# Patient Record
Sex: Male | Born: 1970
Health system: Southern US, Community
[De-identification: ages and names within clinical notes are randomized; demographics above are authoritative.]

## PROBLEM LIST (undated history)

## (undated) DIAGNOSIS — J42 Unspecified chronic bronchitis: Secondary | ICD-10-CM

## (undated) DIAGNOSIS — K589 Irritable bowel syndrome without diarrhea: Secondary | ICD-10-CM

## (undated) DIAGNOSIS — I10 Essential (primary) hypertension: Secondary | ICD-10-CM

## (undated) DIAGNOSIS — M545 Low back pain, unspecified: Secondary | ICD-10-CM

## (undated) DIAGNOSIS — F419 Anxiety disorder, unspecified: Secondary | ICD-10-CM

## (undated) DIAGNOSIS — G8929 Other chronic pain: Secondary | ICD-10-CM

## (undated) DIAGNOSIS — J449 Chronic obstructive pulmonary disease, unspecified: Secondary | ICD-10-CM

## (undated) DIAGNOSIS — Z9289 Personal history of other medical treatment: Secondary | ICD-10-CM

## (undated) HISTORY — PX: HERNIA REPAIR: SHX51

## (undated) HISTORY — PX: LUMBAR LAMINECTOMY: SHX95

## (undated) HISTORY — PX: EYE SURGERY: SHX253

## (undated) HISTORY — PX: INGUINAL HERNIA REPAIR: SUR1180

## (undated) HISTORY — PX: NM MYOVIEW LTD: HXRAD82

## (undated) HISTORY — PX: UMBILICAL HERNIA REPAIR: SHX196

---
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
## 1998-07-16 DIAGNOSIS — Z8739 Personal history of other diseases of the musculoskeletal system and connective tissue: Secondary | ICD-10-CM

## 1998-07-16 HISTORY — PX: HAND SURGERY: SHX662

## 1998-07-16 HISTORY — DX: Personal history of other diseases of the musculoskeletal system and connective tissue: Z87.39

## 2000-01-25 ENCOUNTER — Encounter: Payer: Self-pay | Admitting: Neurosurgery

## 2000-01-29 ENCOUNTER — Encounter: Payer: Self-pay | Admitting: Neurosurgery

## 2000-01-29 ENCOUNTER — Ambulatory Visit (HOSPITAL_COMMUNITY): Admission: RE | Admit: 2000-01-29 | Discharge: 2000-01-30 | Payer: Self-pay | Admitting: Neurosurgery

## 2000-01-30 ENCOUNTER — Encounter: Payer: Self-pay | Admitting: Neurosurgery

## 2002-06-26 ENCOUNTER — Encounter: Admission: RE | Admit: 2002-06-26 | Discharge: 2002-09-24 | Payer: Self-pay

## 2002-09-24 ENCOUNTER — Encounter: Admission: RE | Admit: 2002-09-24 | Discharge: 2002-12-23 | Payer: Self-pay

## 2002-12-28 ENCOUNTER — Encounter
Admission: RE | Admit: 2002-12-28 | Discharge: 2003-03-28 | Payer: Self-pay | Admitting: Physical Medicine & Rehabilitation

## 2003-03-31 ENCOUNTER — Encounter
Admission: RE | Admit: 2003-03-31 | Discharge: 2003-06-29 | Payer: Self-pay | Admitting: Physical Medicine & Rehabilitation

## 2003-08-13 ENCOUNTER — Encounter
Admission: RE | Admit: 2003-08-13 | Discharge: 2003-11-11 | Payer: Self-pay | Admitting: Physical Medicine & Rehabilitation

## 2003-12-29 ENCOUNTER — Encounter
Admission: RE | Admit: 2003-12-29 | Discharge: 2004-03-28 | Payer: Self-pay | Admitting: Physical Medicine & Rehabilitation

## 2004-02-20 ENCOUNTER — Emergency Department (HOSPITAL_COMMUNITY): Admission: EM | Admit: 2004-02-20 | Discharge: 2004-02-20 | Payer: Self-pay | Admitting: Emergency Medicine

## 2004-03-31 ENCOUNTER — Encounter
Admission: RE | Admit: 2004-03-31 | Discharge: 2004-06-28 | Payer: Self-pay | Admitting: Physical Medicine & Rehabilitation

## 2004-03-31 ENCOUNTER — Ambulatory Visit: Payer: Self-pay | Admitting: Physical Medicine & Rehabilitation

## 2004-06-28 ENCOUNTER — Encounter
Admission: RE | Admit: 2004-06-28 | Discharge: 2004-09-26 | Payer: Self-pay | Admitting: Physical Medicine & Rehabilitation

## 2004-09-04 ENCOUNTER — Ambulatory Visit: Payer: Self-pay | Admitting: Physical Medicine & Rehabilitation

## 2004-09-26 ENCOUNTER — Encounter
Admission: RE | Admit: 2004-09-26 | Discharge: 2004-12-25 | Payer: Self-pay | Admitting: Physical Medicine & Rehabilitation

## 2004-10-26 ENCOUNTER — Ambulatory Visit: Payer: Self-pay | Admitting: Physical Medicine & Rehabilitation

## 2004-12-27 ENCOUNTER — Encounter
Admission: RE | Admit: 2004-12-27 | Discharge: 2005-03-27 | Payer: Self-pay | Admitting: Physical Medicine & Rehabilitation

## 2004-12-27 ENCOUNTER — Ambulatory Visit: Payer: Self-pay | Admitting: Physical Medicine & Rehabilitation

## 2009-10-26 ENCOUNTER — Inpatient Hospital Stay (HOSPITAL_COMMUNITY): Admission: AD | Admit: 2009-10-26 | Discharge: 2009-11-02 | Payer: Self-pay | Admitting: Psychiatry

## 2009-10-26 ENCOUNTER — Emergency Department (HOSPITAL_COMMUNITY): Admission: EM | Admit: 2009-10-26 | Discharge: 2009-10-26 | Payer: Self-pay | Admitting: Emergency Medicine

## 2009-10-26 ENCOUNTER — Ambulatory Visit: Payer: Self-pay | Admitting: Psychiatry

## 2009-11-03 ENCOUNTER — Other Ambulatory Visit (HOSPITAL_COMMUNITY): Admission: RE | Admit: 2009-11-03 | Discharge: 2010-01-09 | Payer: Self-pay | Admitting: Psychiatry

## 2010-01-12 ENCOUNTER — Ambulatory Visit: Payer: Self-pay | Admitting: Psychiatry

## 2010-02-20 ENCOUNTER — Emergency Department (HOSPITAL_COMMUNITY): Admission: EM | Admit: 2010-02-20 | Discharge: 2010-02-20 | Payer: Self-pay | Admitting: Emergency Medicine

## 2010-09-29 LAB — GLUCOSE, CAPILLARY: Glucose-Capillary: 108 mg/dL — ABNORMAL HIGH (ref 70–99)

## 2010-09-29 LAB — COMPREHENSIVE METABOLIC PANEL
ALT: 17 U/L (ref 0–53)
AST: 18 U/L (ref 0–37)
Albumin: 4 g/dL (ref 3.5–5.2)
Chloride: 105 mEq/L (ref 96–112)
Creatinine, Ser: 1.03 mg/dL (ref 0.4–1.5)
GFR calc Af Amer: >60 mL/min (ref >60)
Potassium: 3.4 mEq/L — ABNORMAL LOW (ref 3.5–5.1)
Sodium: 138 mEq/L (ref 135–145)
Total Bilirubin: 0.7 mg/dL (ref 0.3–1.2)

## 2010-09-29 LAB — CBC
Hemoglobin: 12.7 g/dL — ABNORMAL LOW (ref 13.0–17.0)
MCH: 29.9 pg (ref 26.0–34.0)
Platelets: 205 10*3/uL (ref 150–400)
RBC: 4.24 MIL/uL (ref 4.22–5.81)
WBC: 4.8 10*3/uL (ref 4.0–10.5)

## 2010-09-29 LAB — RAPID URINE DRUG SCREEN, HOSP PERFORMED
Amphetamines: NOT DETECTED
Tetrahydrocannabinol: NOT DETECTED

## 2010-09-29 LAB — ETHANOL: Alcohol, Ethyl (B): 5 mg/dL (ref 0–10)

## 2010-10-02 LAB — URINE DRUGS OF ABUSE SCREEN W ALC, ROUTINE (REF LAB)
Barbiturate Quant, Ur: NEGATIVE
Barbiturate Quant, Ur: NEGATIVE
Barbiturate Quant, Ur: NEGATIVE
Barbiturate Quant, Ur: NEGATIVE
Benzodiazepines.: NEGATIVE
Benzodiazepines.: NEGATIVE
Benzodiazepines.: NEGATIVE
Benzodiazepines.: NEGATIVE
Benzodiazepines.: NEGATIVE
Ethyl Alcohol: <10 mg/dL (ref <10)
Marijuana Metabolite: NEGATIVE
Marijuana Metabolite: NEGATIVE
Marijuana Metabolite: NEGATIVE
Marijuana Metabolite: NEGATIVE
Marijuana Metabolite: NEGATIVE
Methadone: NEGATIVE
Opiate Screen, Urine: NEGATIVE
Phencyclidine (PCP): NEGATIVE
Propoxyphene: NEGATIVE
Propoxyphene: NEGATIVE
Propoxyphene: NEGATIVE
Propoxyphene: NEGATIVE
Propoxyphene: NEGATIVE

## 2010-10-03 LAB — URINE DRUGS OF ABUSE SCREEN W ALC, ROUTINE (REF LAB)
Amphetamine Screen, Ur: NEGATIVE
Barbiturate Quant, Ur: NEGATIVE
Barbiturate Quant, Ur: NEGATIVE
Barbiturate Quant, Ur: NEGATIVE
Benzodiazepines.: NEGATIVE
Benzodiazepines.: NEGATIVE
Benzodiazepines.: NEGATIVE
Ethyl Alcohol: <10 mg/dL (ref <10)
Ethyl Alcohol: <10 mg/dL (ref <10)
Ethyl Alcohol: <10 mg/dL (ref <10)
Marijuana Metabolite: NEGATIVE
Marijuana Metabolite: NEGATIVE
Marijuana Metabolite: NEGATIVE
Propoxyphene: NEGATIVE

## 2010-10-04 LAB — DIFFERENTIAL
Eosinophils Relative: 1 % (ref 0–5)
Lymphocytes Relative: 16 % (ref 12–46)
Lymphs Abs: 1.4 10*3/uL (ref 0.7–4.0)
Monocytes Absolute: 0.9 10*3/uL (ref 0.1–1.0)
Neutro Abs: 6.7 10*3/uL (ref 1.7–7.7)

## 2010-10-04 LAB — RAPID URINE DRUG SCREEN, HOSP PERFORMED
Amphetamines: NOT DETECTED
Benzodiazepines: NOT DETECTED
Cocaine: POSITIVE — AB

## 2010-10-04 LAB — CBC
HCT: 45.5 % (ref 39.0–52.0)
Hemoglobin: 15.9 g/dL (ref 13.0–17.0)
RBC: 5.2 MIL/uL (ref 4.22–5.81)
WBC: 9.2 10*3/uL (ref 4.0–10.5)

## 2010-10-04 LAB — ETHANOL: Alcohol, Ethyl (B): <5 mg/dL (ref 0–10)

## 2010-10-04 LAB — BASIC METABOLIC PANEL
GFR calc Af Amer: >60 mL/min (ref >60)
GFR calc non Af Amer: >60 mL/min (ref >60)
Glucose, Bld: 142 mg/dL — ABNORMAL HIGH (ref 70–99)
Potassium: 3.7 mEq/L (ref 3.5–5.1)
Sodium: 136 mEq/L (ref 135–145)

## 2010-12-01 NOTE — Assessment & Plan Note (Signed)
A 40 year old male with history of left L5-S1 lumbar disk herniation with  post laminectomy syndrome status post surgery in July of 2002. He was last  seen by me on August 16, 2003. He has had left foot numbness and tingling  as well as his usual axial low back pain. He had some right trapezius pain  and left thoracic paraspinal pain as well. He continues to work 50 or 60  hours a week, does not do much in terms of lifting but walks up to 7 miles  an average work day in a [employer] where he states the temperature was  105 degrees yesterday. His last spinal injection was April of 2004 which  were bilateral facet injections, L4-5/L5-S1 per Dr. Leanord Asal which  resulted in 0 pain score post injection.   His current pain score is 2 to 6/10 and averaging 3/10. His pain improves  with rest and medication, made worse with walking, bending, sitting,  working, but this is relieved by his pain medications.   SOCIAL HISTORY:  Married, smokes a pack a day. Vocational as above.   CURRENT MEDICATIONS:  Hydrocodone 7.5/325 one p.o. b.i.d.   REVIEW OF SYSTEMS:  Positive for numbness in his foot and spasms in his foot  and leg and poor sleep. He has no suicidal thoughts.   PHYSICAL EXAMINATION:  VITAL SIGNS:  Blood pressure 114/65, pulse 88,  respirations 20, O2 saturation 99% on room air.  NEUROLOGICAL:  Gait is normal. Affect is alert. Deep tendon reflexes are  normal bilateral lower extremities. He has decreased sensation to pin in the  left lower extremity, but this is over the entire extremity without  dermatomal pattern.  MUSCULOSKELETAL:  Full range of motion in hips, knees, ankles. No pain to  palpation in the lower extremities. Mild tenderness to palpation in the left  thoracic paraspinals as well as right upper trapezius and mild tenderness to  palpation of the left gluteus medius area. He has full strength bilateral  upper and lower extremities. He does have some limited range of  motion, left  hand.  History of left ulnar injury, does have some reduced grip strength in  the hand on the left side.   IMPRESSION:  1. Lumbar post laminectomy syndrome with lower extremity paresthesias and     radiculitis.  2. Lumbar facet syndrome confirmed by reduction of pain after facet     injection.   PLAN:  1. It is discussed with the patient from injection standpoint that if his     left lumbosacral radiculitis symptoms are more constant, we can do a S1     selective nerve root block on the left side.  2. I believe he would benefit from lumbar facet blocks followed by     radiofrequency if this does alleviate his pain. As he would need Valium     prior to any procedure, he would like to hold off on this right now.  3. Continue current medications.      Erick Colace, M.D.   AEK/MedQ  D:  12/31/2003 11:53:18  T:  12/31/2003 14:57:24  Job #:  [id number]

## 2010-12-01 NOTE — Consult Note (Signed)
NAME:  Jared Robinson, Jared Robinson                         ACCOUNT NO.:  [id number]   MEDICAL RECORD NO.:  [id number]                   PATIENT TYPE:  REC   LOCATION:  [facility]                                  FACILITY:  [facility]   PHYSICIAN:  Zachary George, DO                      DATE OF BIRTH:  09/23/1970   DATE OF CONSULTATION:  08/04/2002  DATE OF DISCHARGE:                                   CONSULTATION   The patient returns to the clinic today as scheduled for a lumbar epidural  steroid injection for degenerative disk disease of the lumbar spine, status  post L5-S1 hemilaminectomy with persistent low back pain and bilateral lower  extremity radicular symptoms as well as bilateral testicular pain which  started following his spinal surgery.  The patient was last seen on July 30, 2002.  He has had no significant change in the interim.  I reviewed  health and history form and 14-point review of systems.   PHYSICAL EXAMINATION:  VITAL SIGNS: Blood pressure 122/65, pulse 93,  respirations 20, O2 saturation 98% on room air.   IMPRESSION:  1. Degenerative disk disease of the lumbar spine with low back pain and     bilateral lower extremity radicular symptoms.  2. Post laminectomy syndrome.  3. Testicular pain, etiology uncertain.   PLAN:  1. Lumbar epidural steroid injection.  2. Continue current medications to include Zonegran 100 mg two p.o. daily,     Norco 7.5 mg one p.o. t.i.d. as needed, and Bextra 20 mg daily.  3. The patient is to return to the clinic in two weeks for reevaluation and     possible repeat injection as predicate upon the patient's response to     symptoms.   DESCRIPTION OF PROCEDURE:  Lumbar epidural steroid injection.  The procedure  was described to the patient in detail including risks, benefits,  limitations, and alternatives.  Risks include, but are not limited to  bleeding, infection, spinal headache, lower extremity numbness, and  paresthesias, failure to relieve  pain, increased back pain, and allergic  reaction to medications.  The patient understands the risks and wishes to  proceed.  The informed consent was obtained. The patient was brought back to  the fluoroscopy suite and placed on the table in the prone position. The  skin was prepped and draped in the usual sterile fashion.  The skin and  subcutaneous tissue were anesthetized with 3 cc of 1% preservative-free  lidocaine.  Under direct fluoroscopic guidance an 18 gauge 3.5 inch Hustead  needle was advanced in the left paramedian L4-5 epidural space with loss of  resistance technique. There were no CSF, heme, or paresthesia noted. This  was then followed by the injection of 1.5 cc of Kenalog 40 mg/cc plus 2.5 cc  normal saline with needle flush.  The patient tolerated the procedure well.  He  did have slight vasovagal reaction.  His blood pressure and pulse  oximetry were monitored closely.  The patient was released in stable  condition.  Blood pressure was 123/84 on discharge.  Discharge instructions  were given.  The patient's pain score on discharge was 0/10.   The patient was educated on above findings and recommendations and  understands. There were no barriers to communication.                                               Zachary George, DO    JW/MEDQ  D:  08/04/2002  T:  08/05/2002  Job:  [id number]

## 2010-12-01 NOTE — Assessment & Plan Note (Signed)
HISTORY OF PRESENT ILLNESS:  A 40 year old male with a history of left L5-S1  lumbar disc herniation.  Has had mainly axial back pain mid-back and  sometimes into his left iliac crest region.  He was last seen by me on  April 02, 2003.  He was to return in December; however, he forgot to  schedule an appointment and this was his next available.  He remains on  hydrocodone 7.5/325 mg 1 p.o. b.i.d. but has been out of his Skelaxin 800 mg  for the last month or so.  He has had some increased pain and discomfort  trying to sleep.  On bad days, he does use some Lidoderm patch but this is  only once a week or so.  He used to work 50 to 60 hours a week.  He does not  do any lifting but walks up to 7 miles during an average workday.  His last  spinal injection was on April July, 2004 which was bilateral facet  injections at L4/5 and L5-S1 per Dr. Zachary George.  The patient did not have  any fascicular pain.   He has had physical therapy prescribed; however, he declined to go because  of copay increase from $15 to $35 a visit.  He is off his Bextra.  Tends to  use his ibuprofen for flare ups.   Pain score is about a 6 out of 10.  He is averaging 4 and ranging from 2 to  6.  His pain exacerbating factors are bending forward, sitting, working, and  improving with rest.   SOCIAL HISTORY:  Pack a day smoker x 8 years.  Married and has two children.  Works full-time.   REVIEW OF SYMPTOMS:  Positive for spasms in the left side of the low back as  well as poor sleep.   PHYSICAL EXAMINATION:  VITAL SIGNS:  Blood pressure 119/68, pulse 85,  respirations 16 and regular.  O2 saturation 99% on room air.  NEUROLOGICAL:  Gait is normal.  Affect is alert, although somewhat irritable  due to pain.  PULSE:  Normal.  BACK:  There is no tenderness to palpation along the lumbar paraspinal or  along the iliac crest.  No pain over the gluteus medius area.  He states  that actually the palpation feels good  rather than hurts.  He is able to  bend forward to about 90 degrees in his low back.  Extension shows he has  about 25% range and this hurts much more than flexion.  He can do lateral  bending bilaterally as well as twisting, approximately 50% range  bilaterally.  His full range of motion bilateral lower extremity is normal.  No pain to palpation in the bilateral lower extremities.  Normal strength in  bilateral lower extremities and no deep tendon reflexes in bilateral lower  extremities.   IMPRESSION:  1. Lumbar facet arthropathy:  He has had previous complete relief from     lumbar vertebrae-4/5 and lumbar verterbrae-5/sacral vertebrae-1 intra-     articular facet joint injections.  I discussed the procedure of     radiofrequency lesioning of the medial branches to the facet joint levels     but this would have to be preceded by a medial branch block using     Lidocaine.  He is somewhat apprehensive about injections and needles in     general and would like to think about this.  In the meantime, we will     continue  hydrocodone 7.5 mg b.i.d. as well as reinitiate Skelaxin 800 mg     b.i.d.  Also, the patient is to use the Lidocaine patch on a p.r.n.     basis.  He can use Bextra for flare-up, although he is somewhat reluctant     to do so based on media stories regarding __________ .   If he does elect to have lumbar medial branch blocks, we will give him 10 mg  of Valium p.o. for the procedure.   DIAGNOSES:  1. Lumbar facet arthropathy, 724.8.  2. Lumbar degenerative disc disease, 722.52.      Jared Robinson, M.D.   AEK/MedQ  D:  08/16/2003 16:51:13  T:  08/16/2003 18:11:38  Job #:  [id number]   cc:   Hewitt Shorts, M.D.  [address]  [place]  [place] [address]  Fax: [phone]   Excell Seltzer. Annabell Howells, M.D.  [address]  [place]  [place] [address]  Fax: [phone]

## 2010-12-01 NOTE — Assessment & Plan Note (Signed)
MEDICAL RECORD NUMBER:  [id number].   A 40 year old male with a history of lumbar post laminectomy syndrome and  lumbar facet syndrome.  Last seen by me on December 31, 2003.  At that time,  discussed selective nerve root block versus facet medial branch blocks to  evaluate for radiofrequency.  He states that his pain level has remained  fairly constant, about 5/10 on average and going from 2-6.  His pain  improves with rest and medication and is made worse with bending and  sitting.   He continues to smoke a pack a day x 7 years.  He continues to work full  time.   REVIEW OF SYSTEMS:  Poor sleep.  Some spasms in his back.   PHYSICAL EXAMINATION:  Blood pressure 137/68, pulse 90, respirations 20, O2  saturation 97% on room air.   CURRENT MEDICATIONS:  Vicodin 7.5/500 mg one p.o. b.i.d.   PHYSICAL EXAMINATION:  GENERAL APPEARANCE:  No acute distress.  Mood and  affect appropriate.  BACK:  Some tenderness at left lumbar paraspinal at L5.  NEUROLOGIC:  He has normal deep tendon reflexes in bilateral lower  extremities.  Normal strength in bilateral lower extremities.  Normal  sensation in bilateral lower extremities.   IMPRESSION:  1.  Lumbar post laminectomy syndrome.  2.  Chronic lower extremity paresthesias.  3.  Radiculitis.  This has been overshadowed by more axial back pain.  More      left-sided lumbar facet syndrome has been confirmed by facet injections.   PLAN:  1.  Recommend lumbar medial branch blocks.  He is quite phobic in regards to      needles and really needs to think about this.  2.  Continue current medication, Vicodin 7.5/500 mg one p.o. b.i.d.  3.  I will see him back in three months unless he decides to undergo the      injections, then I will see him back sooner than that.      Erick Colace, M.D.    AEK/MedQ  D:  04/03/2004 17:50:32  T:  04/04/2004 11:58:44  Job #:  [id number]

## 2010-12-01 NOTE — Consult Note (Signed)
NAME:  Jared Robinson, Jared Robinson                         ACCOUNT NO.:  [id number]   MEDICAL RECORD NO.:  [id number]                   PATIENT TYPE:  REC   LOCATION:  [facility]                                  FACILITY:  [facility]   PHYSICIAN:  Jared George, DO                      DATE OF BIRTH:  [birth year]   DATE OF CONSULTATION:  07/30/2002  DATE OF DISCHARGE:                                   CONSULTATION   HISTORY:  Jared Robinson returns to clinic today for a re-evaluation.  He was  last seen on 07/13/02.  In the interim he has had an MRI of his thoracic  spine which does not reveal any herniated disks but does reveal multiple  Schmorl's nodes which raises the possibility for Scheuermann's disease  however, I doubt this as patient does not have any significant thoracic  pain.  He does have persistent testicular pain for which I was looking for  nerve root impingement at the lower thoracic/upper lumbar region.  Patient  states that following last visit he felt like he was getting a little bit  better with Zonegran 200 mg daily and Bextra 20 mg daily however, over the  past few days he has had increased testicular pain and describes some  swelling and significant tenderness to palpation of his testicles.  When he  saw Jared Robinson previously he did not have any swelling or tenderness to  palpation.  He also continues to have low back pain radiating to the left  buttock and right anterior thigh and describes a deep itch in his lower  back.  He is status post left L5-S1 hemilaminectomy with diskectomy.  I  reviewed health and history form, 14 point review of systems.  Pain today is  9/10 on a subjective scale.  He continues taking hydrocodone 7.5 mg three  times per day.  Function and quality of life indices have declined.  His  pain has gotten bad enough where he has thought about calling in sick to  work.  In addition to the hydrocodone he has taken Bextra and Zonegran but  has run out of the Zonegran.   PHYSICAL EXAMINATION:  GENERAL:  Physical examination reveals a healthy male  in no acute distress.  VITALS:  Blood pressure is 128/60, pulse 79, respirations 20, oxygen  saturation 98% on room air.  NEUROLOGICALLY:  Intact in the lower extremities including motor, sensory  and reflexes at this time.  GENITALIA:  Examination of the testicles reveals significant tenderness to  palpation over the right testicle without any masses.  The right testicle  does feel modestly larger than the left testicle.  There is no scrotal  swelling noted and no discoloration.   IMPRESSION:  1. Degenerative disk disease of the lumbar spine, status post left L5-S1     hemilaminectomy with persistent low back pain and bilateral lower  extremity radicular symptoms.  2. Testicular pain, etiology uncertain.  Patient has significant local     tenderness to the right testicle today.   PLAN:  1. Discussed treatment options with Jared Robinson.  In terms of his lower     back and lower extremity radicular pain it is reasonable to proceed with     a lumbar epidural steroid injection and I discussed this with him at     length.  I am uncertain whether or not the lumbar epidural steroid     injection will help his testicular pain.  If his testicular pain persists     I would like to have him re-evaluated by Jared Robinson at some point if he     continues to have significant local tenderness.  2. Continue Norco 7.5 mg/325 mg one p.o. t.i.d. as needed, #50 without     refills.  3. Continue Bextra 20 mg one p.o. daily, #30 with one refill.  4. Resume Zonegran 100 mg two p.o. daily, #60 with one refill.  5. Patient to return to clinic for lumbar epidural steroid injection.   Patient was educated on the above findings and recommendations and  understands.  There were no barriers to communication.                                               Jared George, DO    JW/MEDQ  D:  07/30/2002  T:  07/30/2002  Job:  [id number]

## 2010-12-01 NOTE — Op Note (Signed)
[facility]  Patient:    Jared Robinson, Jared Robinson                      MRN: [id number] Proc. Date: 01/29/00 Adm. Date:  [id number] Disc. Date: [id number] Attending:  Barton Fanny CC:         Hewitt Shorts, M.D.                           Operative Report  PREOPERATIVE DIAGNOSIS:  Left L5-S1 lumbar disk herniation.  POSTOPERATIVE DIAGNOSIS:  Left L5-S1 lumbar disk herniation.  PROCEDURE:  Left L5-S1 lumbar laminotomy and microdiskectomy.  SURGEON:  Hewitt Shorts, M.D.  ASSISTANT:  Tanya Nones. Jeral Fruit, M.D.  ANESTHESIA:  General endotracheal.  INDICATION FOR PROCEDURE:  The patient 40 year old man who presented with left lumbar radiculopathy that was found to be secondary to a moderate sized left L5-S1 disk herniation and the decision was made to proceed with left elective laminotomy and microdiskectomy.  PROCEDURE IN DETAIL:  The patient was brought to operating room and placed under general endotracheal anesthesia.  The patient was turned to a prone position and the lumbar region was prepped with Betadine and soap solution and draped in a sterile fashion.  The midline was infiltrated with local anesthetic of epinephrine and a midline incision was made and carried down through the subcutaneous tissue.  Bipolar cautery and electrocautery were used to maintain hemostasis.  Dissection was carried down to the lumbar fascia which was incised on the left side of the midline in the paraspinal muscle with dissection of the spinous process and lamina in a subperiosteal fashion. The L5-S1 interlaminar space was identified and this localization was confirmed with x-ray.  Laminotomies were performed using the Midas Rex drill with ______ bur and Kerrison punches.  ______ was removed and the underlying thecal sac and nerve were identified.  The microscope was draped and brought in the field to provide instrument location, illumination  and visualization.  The remainder of the procedure was performed using microdissection technique.  The thecal sac and nerve root were mobilized medially exposing the disk herniation.  The remaining annular fibers were incised and disk fragment removed.  There was extensive degenerative disk material and this was gradually mobilized.  The disk space was entered and additional degenerative disk material was removed.  ______ degeneration, thickening of the posterior longitudinal ligament was removed as well.  A third diskectomy was performed with removal of all disk fragments from both the disk space and the epidural space and then good decompression of thecal sac and nerve root were achieved.  Once the diskectomy was completed hemostasis was accomplished with the use of bipolar cautery and then 2 ccs of Fentanyl and 80 mg of Depo-Medrol were infused into the epidural space.  The wound was then closed in multiple layers.  The deep fascia was closed with interrupted 0 Vicryl with sutures of the subcutaneous, subcuticular and posterior ______ were inverted with 2-0 Vicryl sutures.  The skin was reapproximated with Steri-Strips and was dressed with Adaptic and sterile gauze.  The patient tolerated the procedure well.  The estimated blood loss was 50 ccs.  Sponge counts were correct.  Following surgery, the patient was turned back to the supine position to be reversed from the anesthetic, extubated and transferred to the recovery room for further care. DD:  01/29/00 TD:  01/29/00 Job: [id number]

## 2010-12-01 NOTE — H&P (Signed)
[place]  Patient:    Jared Robinson, Jared Robinson                      MRN: [id number] Adm. Date:  [id number] Attending:  Barton Fanny CC:         Hewitt Shorts, M.D.                         History and Physical  CHIEF COMPLAINT: The patient is a 40 year old right-handed white male who was evaluated for left lumbar radiculopathy and left L5-S1 lumbar disk herniation.  HISTORY OF PRESENT ILLNESS: He explains he has had some intermittent mild radicular discomfort running down through his left lower extremity over the past 1-1/2 years.  He has been treated with medications and exercise but his difficulties worsened about 1-1/2 months ago when he picked something up and then two days later developed excruciating pain to his low back.  Subsequently the pain extended down into the left lower extremity and was disabling, with pain radiating to the left buttock, posterior thigh and calf, into the foot and toward the left great toe.  He has had minimal symptoms in his right lower extremity.  He has had a sense of weakness in his left lower extremity but denies any numbness or paresthesias.  He tried bed rest and medications but continued to have significant discomfort that required surgical intervention, and is now admitted for such.  He finds that even minimal walking tends to aggravate his pain and discomfort.  PAST MEDICAL HISTORY: There is no history of hypertension, myocardial infarction, cancer, stroke, diabetes, peptic ulcer disease, or lung disease.  PAST SURGICAL HISTORY:  1. Herniorrhaphy in 1976 and 1986.  2. Hand surgery for traumatic injury to the left hand.  ALLERGIES: ASPIRIN causes rash.  MEDICATIONS: His only medication is Vicodin for pain.  FAMILY HISTORY: His parents are both in good health at age 21.  SOCIAL HISTORY: The patient is married and has six children.  He smokes a pack of cigarettes a day and has been smoking off  and on for at least the past six years.  He drinks alcoholic beverages socially.  He denies history of substance abuse.  He works as a Merchandiser, retail at [employer].  REVIEW OF SYSTEMS: Notable for those symptoms described in the History of Present Illness and past medical history, but otherwise unremarkable.  PHYSICAL EXAMINATION:  GENERAL: The patient is a well-developed, well-nourished white male in obvious discomfort and with limited mobility.  VITAL SIGNS: Height 5 feet 10 inches.  Weight 158 pounds.  Temperature 997.7 degrees, pulse 94, respiratory rate 16, blood pressure 143/75.  LUNGS: Clear to auscultation.  Symmetrical respiratory excursion.  HEART: Regular rate and rhythm.  Normal S1 and S2 with no murmur.  ABDOMEN: Soft, nondistended.  Bowel sounds present.  EXTREMITIES: No clubbing, cyanosis, or edema.  MUSCULOSKELETAL: Tenderness to palpation of the lumbar region, particularly the left side and low back and less so off to the right side of the low back. His mobility is significantly limited.  He is able to flex to about 15-20 degrees.  He is limited in extension as well to as little as five degrees. Straight leg raising is positive on the left on 15-20 degrees and straight leg raising is negative on the right.  NEUROLOGIC: Examination shows 5/5 strength of the distal lower extremities including dorsiflexion, plantar flexion, and extensor hallucis longus; however,  in the distal left lower extremity his ability to exert full effort is limited by pain.  Strength seems to be at least 4-4+/5 in the dorsiflexion, plantar flexion, and extensor hallucis longus.  Sensation is intact to pinprick throughout the lower extremities.  Reflexes are 1-2 at the quadriceps, gastrocnemius and are symmetric bilaterally.  Toes are downgoing bilaterally.  DIAGNOSTIC STUDIES: MRI of the lumbar spine shows desiccation of the L5-S1 disk with loss of disk space height and left L5-S1  lumbar disk herniation moderate in size with left S1 nerve root compression.  IMPRESSION: Acute left lumbar radiculopathy with left L5-S1 lumbar disk herniation superimposed upon underlying degenerative disk disease.  PLAN: The patient is admitted for left L5-S1 lumbar laminotomy and microdiskectomy.  We discussed the alternatives to surgical intervention as well as the nature of surgery, typical length of surgery and hospital stay, and overall recuperation, his limitations during the postoperative period, and risk of surgery including risk of infection, bleeding, possible need for transfusion, risk of nerve dysfunction with pain, weakness, numbness, or paresthesias, risk of recurrent disk herniation with possible need for further surgery, and anesthetic risks of myocardial infarction, stroke, pneumonia, and death, all of which are increased due to his smoking history.  Understanding all this the patient does wish to proceed with surgery, and is admitted for such. DD:  01/29/00 TD:  01/29/00 Job: [id number]

## 2010-12-01 NOTE — Consult Note (Signed)
NAME:  Jared Robinson, Jared Robinson                         ACCOUNT NO.:  [id number]   MEDICAL RECORD NO.:  [id number]                   PATIENT TYPE:  REC   LOCATION:  [facility]                                  FACILITY:  [facility]   PHYSICIAN:  Zachary George, DO                      DATE OF BIRTH:  [birth year]   DATE OF CONSULTATION:  10/13/2002  DATE OF DISCHARGE:                                   CONSULTATION   REASON FOR CONSULTATION:  The patient returns to clinic today, originally  scheduled for a repeat lumbar steroid epidural steroid injection for  degenerative disk disease of the lumbar spine with chronic low back pain  with bilateral lower extremity radicular symptoms.  The patient was last  seen on September 04, 2002.  In the interim the patient has had nearly  complete resolution of his testicular and lower extremity radicular pain.  He has had on one occasion some numbness in his right lower extremity but  that only lasted for a few minutes and resolved.  His pain seems to be very  well controlled and he has actually started to decrease his medications.  He  states he stopped taking Zonegran and Bextra and has decreased his Norco to  one to two per day.  He has been taking Norco 7.5/325.  His pain today is  3/10 on a subjective scale.  His function and quality of life indices have  improved.  His sleep has improved as well.  I reviewed health and history  form, a 14-point review of systems.  No new neurologic complaints.   PHYSICAL EXAMINATION:  GENERAL:  Reveals a healthy male in no acute  distress.  VITAL SIGNS:  Blood pressure 120/60, pulse 82, respirations 16, O2  saturation 100% on room air.  NEUROLOGIC:  Intact in the lower extremities including motor, sensory, and  reflexes today.   IMPRESSION:  1. Low back pain with bilateral lower extremity radicular symptoms,     essentially resolved.  2. Testicular pain, essentially resolved.  3. Degenerative disk disease of the lumbar spine  status post lumbar     laminectomy for herniated disk.   PLAN:  1. The patient and I discussed further treatment options.  At this point it     is reasonable to continue with the Bextra 20 mg daily as needed and to     discontinue hydrocodone on a regular basis.  I have instructed him to     keep the hydrocodone to use as a rescue medication only.  2. The patient to continue with a home exercise program.  3. If symptoms seem to worsen or he has an exacerbation, he should return to     clinic for further evaluation.  Would consider repeat epidural steroid     injection as predicated upon the patient's symptoms and findings.     Otherwise, I  will have him follow up on an as-needed basis.  I have given     him a new prescription today for Bextra 20 mg one p.o. daily as needed     #30 with three refills.  The patient to follow up with his primary care     Bader Stubblefield.   The patient was educated on the above findings and recommendations and  understands.  No barriers to communication.                                               Zachary George, DO    JW/MEDQ  D:  10/13/2002  T:  10/13/2002  Job:  [id number]

## 2010-12-01 NOTE — Discharge Summary (Signed)
[place]  Patient:    Jared Robinson, Jared Robinson                      MRN: [id number] Adm. Date:  [id number] Disc. Date: [id number] Attending:  Barton Fanny                           Discharge Summary  HISTORY OF PRESENT ILLNESS:  Patient is a 40 year old gentleman who presented with a left lumbar radiculopathy that was found to be secondary to an L5-S1 lumbar disk herniation.  He had been treated with analgesics with anti-inflammatories without relief.  PHYSICAL EXAMINATION:  ___________ on examination was limited due to pain. His general exam was unremarkable.  Neurologic examination: Positive straight leg raising on the left at 15-20 degrees but otherwise it was intact.  HOSPITAL COURSE:  Patient was admitted, underwent an left L5-S1 lumbar laminotomy and microdiskectomy from which he has done well.  He has had excellent relief of his radicular pain and has been up and ambulating.  He has been given instructions on wound care and activities following discharge.  He is to return to my office in three weeks for follow up.  He is to call today for that appointment.  A discharge prescription was given for Percocet one to two tablets p.o. q.4-6h. p.r.n. pain, 40 tabs prescribed, no refills.  He is also advised to continue on the Naprosyn that he was taking prior to discharge.  His admission chest x-ray described a 1.5 cm round density in the left lower chest that was felt to maybe be a nipple shadow.  He had a PA view of the chest repeated with nipple markers and the abnormality does not correspond to his nipple and the radiologist suggested a CT scan through the chest.  This has been ordered and will be performed prior to discharge and we will follow up with the patient in the office regarding its results and if further consultation is indicated it will be arranged.  DISCHARGE DIAGNOSES: 1. Lumbar disk herniation. 2. Lumbar degenerative disk  disease. 3. Lumbar radiculopathy. DD:  01/30/00 TD:  01/30/00 Job: [id number]

## 2010-12-01 NOTE — Consult Note (Signed)
NAME:  Jared Robinson, Jared Robinson                         ACCOUNT NO.:  [id number]   MEDICAL RECORD NO.:  [id number]                   PATIENT TYPE:  REC   LOCATION:  [facility]                                  FACILITY:  [facility]   PHYSICIAN:  Zachary George, DO                      DATE OF BIRTH:  1970-11-10   DATE OF CONSULTATION:  DATE OF DISCHARGE:                                   CONSULTATION   [facility]:   HISTORY OF PRESENT ILLNESS:  The patient returns to clinic today for  reevaluation.  He was last seen on 08/04/2002 at which time he underwent a  lumbar epidural steroid injection for degenerative disk disease of the  lumbar spine with low back pain and bilateral lower extremity radicular  symptoms status post lumbar hemilaminectomy at L5-S1 for lumbar disk  herniation.  The patient also has testicular pain of uncertain etiology.  He  states that following the lumbar epidural steroid injection he had increased  pain for about 3 days.  After that his pain decreased significantly and he  still notices significant improvement in his lower back pain and lower  extremity radicular symptoms.  He states that he had decreased right  testicular pain for about 2 days after the procedure however, this now back  to baseline.  He continues to notice increased lower back and testicular  pain with increased walking.  He has been able to decrease his need for  hydrocodone following the injection with increased function and quality of  life indices.  His pain today is a 4-5/10 on a subjective scale at this  time.  He states that he and his wife have been sexually active since I last  him and he noticed a prominent vein on his penis with what he describes as  three knots with pain.  He denies any scrotal swelling.  He also describes  some intermittent pain in his left upper lumbar region radiating to his  flank and right upper quadrant.  He states that the right upper  quadrant  pain seems to be worse when his right testicle hurts.  His left testicle  does not seem to hurt him so much except when he palpates it directly.  He  denies any bowel and bladder dysfunction.  He denies any hematuria.  Denies  constipation or diarrhea.  Denies symptoms of irritable bowel syndrome.  He  would like to follow up with his urologist prior to any further  interventional procedures.  I reviewed the health and history form and 14-  point review of systems.  He continues on Bextra 20 mg daily and Zonegran  200 mg daily for radicular component.  He also continues hydrocodone 7.5  mg/325 mg as needed occasionally only needing one pill per day.  His sleep  is good.  Again, his function and quality  of life indices have improved  following the epidural injection.   PHYSICAL EXAMINATION:  GENERAL:  Healthy-appearing male in no acute  distress.  VITAL SIGNS:  Blood pressure is 128/70, pulse 88, respirations 18, O2  saturation is 98% on room air.  SPINE:  Level pelvis without scoliosis.  There are no signs of infection.  There is some tenderness to palpation in the left upper lumbar paraspinous  muscles with no pain on percussion of the paraspinous and left flank.  ABDOMEN:  Nondistended soft abdomen with bowel sounds.  There is mild  tenderness to palpation in the right upper quadrant.  No rebound tenderness,  rigidity, or guarding noted.  No abdominal bruits noted.  NEUROLOGICAL:  Intact to motor, light touch, and reflexes in the lower  extremities.   IMPRESSION:  1. Low back pain with bilateral lower extremity radicular symptoms,     significantly improved.  2. Degenerative disk disease of the lumbar spine status post lumbar     laminectomy for a herniated disk.  3. Postlaminectomy syndrome.  4. Testicular pain, etiology uncertain.   PLAN:  1. Discussed further treatment options with the patient.  This was extensive     consultation of 25 minutes' duration.  At this  point it would be     reasonable to repeat the lumbar epidural steroid injection to further     decrease the patient's lower back and lower extremity radicular symptoms.     However, I do not know if this will help relieve his testicular pain.  I     am still uncertain whether or not the testicular pain is secondary to     spinal origin as there was not any evidence of disk protrusion or disk     herniation in the upper lumbar/lower thoracic region.  The patient would     like to follow up with his urologist, Dr. Annabell Howells, before any further     interventional procedures.  2. Continue Bextra 20 mg daily.  3. Continue Zonegran 200 mg daily.  4. Continue hydrocodone as needed.  I would like to decrease the narcotic     pain medication over time.  The interventional procedure should help in     decreasing need for narcotic-based pain medication as well as increasing     the patient's function and quality of life indices in regards to his     lower back and lower extremity radicular pain.  5. The patient to return to clinic in 2 weeks for reevaluation or sooner as     needed.  Will consider repeat lumbar epidural steroid injection.   The patient was educated on above findings and recommendations and  understands.  There were no barriers to communication.                                               Zachary George, DO    JW/MEDQ  D:  08/18/2002  T:  08/18/2002  Job:  [id number]   cc:   Excell Seltzer. Annabell Howells, M.D.  [address]  [place]  [place] [address]  Fax: [phone]

## 2010-12-01 NOTE — Consult Note (Signed)
NAME:  Jared Robinson, Jared Robinson                         ACCOUNT NO.:  [id number]   MEDICAL RECORD NO.:  [id number]                   PATIENT TYPE:  REC   LOCATION:  [facility]                                  FACILITY:  [facility]   PHYSICIAN:  Zachary George, DO                      DATE OF BIRTH:  Mar 07, 1971   DATE OF CONSULTATION:  06/29/2002  DATE OF DISCHARGE:                                   CONSULTATION   <REFERRING PHYSICIAN/>  Excell Seltzer. Annabell Howells, M.D.   Dear Dr. Annabell Howells,  Thank you very much for kindly referring the [facility]. The patient was seen in the  clinic today. Please refer to the following for details regarding the  history and physical examination and treatment plan. Once again, thank you  for allowing [place] to participate in the care of the patient.   CHIEF COMPLAINT:  Lower back and testicular pain.   HISTORY OF PRESENT ILLNESS:  The patient is a pleasant 40 year old right-  hand dominant male who complains of lower back pain radiating to his left  posterolateral thigh and occasionally into his left  anterior thigh. He is  status post left L5-S1 laminectomy in July 2001 for what he describes as a  herniated disk with left lower extremity radicular symptoms per Dr.  Newell Coral.   Over the past five to six months, the patient has developed pain in  bilateral testicles, right greater than left, radiating into his right  medial thigh with perineal discomfort as well. According to his report, he  was  found to have  an enlarged prostate on  physical examination and was  referred to Dr. Annabell Howells. Further workup included scrotal ultrasound which  revealed mild fluid in bilateral scrotal sacs. Otherwise normal. He also  underwent a CT scan of his abdomen which according to his report is  negative.   An MRI of the lumbar spine was performed on April 03, 2002, which  revealed mild ligamentum of Flavum hypertrophy with  minimal  concentric disk  bulging at L4-5. At L5-S1, there are post surgical  changes with evidence of  a left laminectomy and scar tissue, minimally deforming the left aspect of  the thecal sac with the nerve roots in normal position with no deviation or  displacement. There is some minimal disk bulging to the right of midline  with some minimal right foraminal narrowing.   Current treatment includes hydrocodone 7.5 mg, 0 to 4 per day. The patient  has tried Vioxx without relief and Ultracet without relief. He continues  performing back exercises which were instructed by Dr. Newell Coral. He has not  had any physical therapy. He denies any lumbar spinal injections. He notes  increased pain with stress. Currently his pain level is a 6/10 on a  subjective scale and described as achy, sharp and  stabbing, worse with  walking and occasionally with increased back pain with coughing, sneezing  and bowel movements. His symptoms are improved with medications. He states  that would rather not have to take hydrocodone long-term, but it does help  him with the pain. His functional quality of life indices have declined. His  sleep is fair to poor. I reviewed health and history form of 14 point review  of systems.   PAST MEDICAL HISTORY:  Lower back pain.   PAST SURGICAL HISTORY:  1. L5-S1 lumbar laminectomy.  2. Left hand surgery.  3. Left inguinal herniorrhaphy.  4. Umbilical herniorrhaphy.   FAMILY HISTORY:  Degenerative disk disease   SOCIAL HISTORY:  The patient smokes one pack of cigarettes per day and I  counseled him on the importance of smoking cessation in terms of  pain and  overall health. The patient admits to social alcohol use. He denied illicit  drug use. He is married and has six children, three of whom are step  children. 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   ALLERGIES:  ASPIRIN CAUSES RASH.   MEDICATIONS:  Hydrocodone 7.5 mg p.r.n.    PHYSICAL EXAMINATION:  GENERAL:  A healthy male in no acute distress.  BACK:  Examination reveals a slightly lower right hemipelvis without gross  scoliosis. There is decreased lumbar lordosis with a healed vertical midline  incisional scar. Further examination of the thoracolumbar paraspinals  reveals mild tenderness. I am not able to elicit the patient's complaint  symptoms with palpitation. The range of motion of the lumbar spine is  guarded in flexion but full in extension without much discomfort.   Manual muscle testing is 5/5 bilateral lower extremities. Sensory  examination is intact to light touch bilateral lower extremities including  the peroneal and scrotal region. Muscle stretch reflexes are 2+/4 bilateral  patella and medial hamstrings and Achilles. Straight leg raising is negative  bilaterally but with tight hamstrings. Pearlean Brownie is negative bilaterally with  tight hip flexors.   Testicular examination does not reveal any masses or discomfort on  palpation.   IMPRESSION:  1. Degenerative disk disease of the lumbar spine, status post L5-S1     laminectomy with persistent low back pain and left lower extremity     radicular symptoms.  2. Testicular pain, etiology uncertain. This may be secondary to     thoracolumbar somatic dysfunction and possibly even facet arthropathy.   PLAN:  1. I had a discussion with the patient regarding treatment options.     Initially I would like to gather more records to include clinic notes     and operative report from Dr. Newell Coral.  2. Will begin Zonegran 100 mg p.o. q.d. for neuropathic component.  3. Will begin Bextra 20 mg 1 p.o. q.d. for primary pain control.  4. Will continue with Norco 7.5  mg /325 mg 1 p.o. t.i.d. p.r.n. pain not     controlled with the above medications.  5. Consider physical therapy to review a lumbar stabilization program.  6. Consider a trial of lumbar epidural steroids. 7. The patient is to return to the clinic in  two weeks for reevaluation.   The patient was educated about the findings and recommendations and  understands.  There were no barriers to communication.  Zachary George, DO    JW/MEDQ  D:  06/29/2002  T:  06/29/2002  Job:  [id number]

## 2010-12-01 NOTE — Assessment & Plan Note (Signed)
MEDICAL RECORD NUMBER:  [id number]   DATE OF BIRTH:  [birth year]   Jared Robinson follows up today, he is in the nursing followup clinic. He was  last seen by MD June 30, 2004.  He has a history of lumbar post  laminectomy syndrome.   He has done well in the last month although last visit he had increased pain  and numbness without any history of trauma. This did eventually subside over  the course of a couple of weeks.   Current pain is 2/10, average is 4/10.  His sleep is fair. Pain gets worse  with prolonged sitting as well as standing but better with walking. Relief  with meds about 80%.   Current pain regimen is Vicodin 7.5 b.i.d.   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 He has some  numbness and tingling and some spasms in the lower extremity on review of  systems.  No suicidal ideation.   SOCIAL HISTORY:  He is married.   PHYSICAL EXAMINATION:  Blood pressure 122/69, pulse 92, respiratory rate 16,  O2 sat 99% on room air.   The patient is alert and oriented x3. His gait is normal. His back has some  minor tenderness to palpation in the lumbar paraspinal's. He has full  strength in the bilateral lower extremities, he has full range of motion in  the bilateral lower extremities, he has full range of motion bilateral lower  extremities, normal strength bilateral lower extremities, normal deep tendon  reflexes bilateral lower extremities. He is able to toe walk and heel walk.  His range of motion in the spine is 75% forward flexion and extension. He  has some pain with extension greater than flexion.   IMPRESSION:  1.  Lumbar facet syndrome with low back pain.  2.  Lumbar post laminectomy syndrome. He has some intermittent radiculitis.   PLAN:  1.  Continue current medications.  2.  Continue nursing visits in between MD visits.      AEK/MedQ  D:  10/27/2004 12:17:32  T:  10/27/2004 12:51:41  Job #:  [id number]

## 2010-12-01 NOTE — Consult Note (Signed)
NAME:  Jared Robinson, Jared Robinson                         ACCOUNT NO.:  [id number]   MEDICAL RECORD NO.:  [id number]                   PATIENT TYPE:  REC   LOCATION:                                       FACILITY:   PHYSICIAN:  Zachary George, DO                      DATE OF BIRTH:  1971/01/03   DATE OF CONSULTATION:  09/04/2002  DATE OF DISCHARGE:                                   CONSULTATION   HISTORY OF PRESENT ILLNESS:  The patient returns to the clinic today for  reevaluation.  He was last seen in clinic on August 18, 2002.  He was  originally scheduled to possibly undergo a lumbar epidural steroid injection  today, however, he did not bring a driver with him.  He also wanted to wait  until he spoke with his urologist, Excell Seltzer. Annabell Howells, M.D., prior to proceeding  with further infections.  He was supposed to have an appointment yesterday,  but he states that he has been working two shifts at work and forgot about  the appointment.  He will reschedule.  He continues to complain of low back  pain, but states it has improved overall.  He has significantly decreased  morning stiffness.  He still has some pain involving his left posterior  thigh and right anterior thigh.  His anterior right thigh pain seems to be  associated with his testicular pain.  Overall his radicular symptoms have  improved.  He has noted some intermittent numbness in his left foot with  walking.  It lasts just a brief amount of time.  His pain today is a 4/10 on  a subjective scale.  Function and quality of life indices have improved, but  remain declined overall.  He feels like the epidural sternoid injection on  August 04, 2002, helped him.  His testicular pain is also improved.  He  states that previously it was a 10/10 on a subjective scale and now his  testicular pain is a 6/10 on a subjective scale and intermittent.  Previously it was constant.  I reviewed the health and history form and 14-  point review of systems.   The patient and I spent a considerable time  discussing his symptoms and further treatment options.  I also answered  several questions.  This was a 25-minute evaluation.   PHYSICAL EXAMINATION:  GENERAL APPEARANCE:  A healthy male in no acute  distress.  VITAL SIGNS:  Blood pressure 133/69, pulse 94, respirations 18, O2  saturation 97% on room air.  BACK:  Lower right hemipelvis of approximately 3/8 inch.  There is  tenderness to palpation, left greater than right lumbar paraspinous muscles.  Range of motion is functional.  NEUROLOGIC:  Examination is intact in the lower extremities, including  motor, sensory, and reflexes.  EXTREMITIES:  The patient has slight leg length discrepancy when examined in  a supine position.  The right leg appears to be approximately 3/8 inch  shorter.   IMPRESSION:  1. Low back pain with bilateral lower extremity radicular symptoms,     improved.  2. Degenerative disk disease of the lumbar spine, status post lumbar     laminectomy of left L5-S1 for herniated disk.  3. Post laminectomy syndrome.  4. Testicular pain, etiology uncertain.  5. Leg length discrepancy with right leg shorter by 3/8 inch.   PLAN:  1. Will continue current medications to include Bextra 20 mg daily, Zonegran     200 mg daily, and Norco 7.5 mg/325 mg one p.o. t.i.d. as needed.  2. A 1/4 inch heel lift is to be used gradually up to eight hours per day     and then will increase to 3/8 inch heel lift on the right.  3. Recommend repeating lumbar epidural steroid injection as the patient     still has room for improvement.  I discussed this with him at length,     including risks, benefits, limitations, and alternatives again.  He     wishes to proceed.  Will schedule him in two weeks.  4. The patient is to follow up with his urologist.   The patient was educated on the above findings and recommendations and  understands.  There were no barriers to communication.                                                Zachary George, DO    JW/MEDQ  D:  09/04/2002  T:  09/04/2002  Job:  [id number]   cc:   Excell Seltzer. Annabell Howells, M.D.  [address]  [place]  [place] [address]  Fax: [phone]

## 2010-12-01 NOTE — Assessment & Plan Note (Signed)
MEDICAL RECORD NUMBER:  [id number].   DATE OF BIRTH:  [birth year].   Jared Robinson returns today after I last saw him approximately three months  ago. A 40 year old male with history of lumbar post laminectomy syndrome  with lumbar facet syndrome last seen by me three months ago. He has done  relatively well. His pain level ranges from 3 to 6. He is a bit worse today  because he worked a double shift last night and got poor sleep.   He works full time. His pain is mainly worse with sitting up straight,  bending backwards. Improves with pulling his knees to his chest.   SOCIAL HISTORY:  As noted above. He is married, smokes a pack a day for 7  years.   REVIEW OF SYSTEMS:  Numbness, weakness in the left lower extremity.   PHYSICAL EXAMINATION:  Blood pressure 117/72, pulse 85, respiratory rate 16,  O2 98% on room air.   Back has no tenderness to palpation of lumbar spine. He has pain with  extension and relative relief with flexion. He has good range of the hips,  negative Faber's, and no pain to palpation bilateral hips. Normal deep  tendon reflexes bilaterally lower extremities.   IMPRESSION:  1.  Lumbar post laminectomy syndrome.  2.  Lumbar facet syndrome which has been confirmed by facet injections in      the past.   RECOMMENDATIONS:  He has been extremely stable on Vicodin 7.5 p.o. b.i.d.  Will refer him to the [facility] followup clinic, and I discussed this with him.  He will see me in about 12 months, unless he decides to go through injection  which he has been contemplating, but not wanting to do secondary to a needle  phobia. He has had no signs of dosage escalation. No signs of early  prescription filling, and no suicidal tendencies.      Andr   AEK/MedQ  D:  06/30/2004 10:02:29  T:  06/30/2004 15:09:04  Job #:  [id number]

## 2010-12-01 NOTE — Consult Note (Signed)
NAME:  Jared Robinson, Jared Robinson                         ACCOUNT NO.:  [id number]   MEDICAL RECORD NO.:  [id number]                   PATIENT TYPE:  REC   LOCATION:  [facility]                                  FACILITY:  [facility]   PHYSICIAN:  Zachary George, DO                      DATE OF BIRTH:  07-30-1970   DATE OF CONSULTATION:  DATE OF DISCHARGE:                                   CONSULTATION   HISTORY OF PRESENT ILLNESS:  The patient returns to the clinic today for  reevaluation.  He was initially seen on June 29, 2002.  In the interim,  he has not noticed any significant change other than worsening of his pain  over the past week.  He continues to complain of low back pain radiating to  bilateral buttocks, right posterior thigh, and leg, as well as pain  radiating to bilateral testicles.  He has taken Zonegran 100 mg per day over  the past two weeks for radicular component, which has not really helped his  symptoms.  He has not taken Bextra as he was concerned with his aspirin  allergy.  He states that the aspirin caused him to have a rash.  He has  taken multiple nonacetylated nonsteroidal anti-inflammatories in the past  without any reactive.  His pain today is a 7/10 on a subjective scale.  Function and quality of life remains declined, although he continues  working.  He does seem to be somewhat discouraged with the current  situation.  I reviewed the health and history form and 14-point review of  systems.  He continues to smoke one packs of cigarettes per day and I  counsel him on the importance of smoking cessation.   PHYSICAL EXAMINATION:  VITAL SIGNS:  The blood pressure is 123/63, pulse 83,  respirations 16, and O2 saturations 99% on room air.  BACK:  Tenderness to palpation in the thoracolumbar paraspinous region with  tight paraspinal muscles.  There is minimal tenderness to palpation in the  lumbar paraspinous region as well.  NEUROLOGIC:  Manual muscle testing is 5/5 in bilateral  lower extremities.  The sensory examination is intact to light touch in bilateral lower  extremities at this time.  Muscle stretch reflexes are 2+/4 in bilateral  patellae, medial hamstrings, and Achilles.  Straight leg raise is negative  bilaterally, but with tight hamstrings.   IMPRESSION:  1. Degenerative disk disease of the lumbar spine, status post L5-S1     laminectomy with persistent low back pain and left lower extremity     radicular symptoms.  2. Testicular pain, etiology uncertain.  Again, I feel that this potentially     could be secondary to somatic dysfunction of the thoracolumbar region,     but cannot conclusively rule out disk herniation.   PLAN:  1. Will obtain an MRI of the thoracic spine without contrast  to rule out     herniated disk with attention to the lower thoracic region.  2. Increase Zonegran to 200 mg per day and I provide the patient with 28     sample pills.  3. Start Bextra 20 mg daily and the patient has 15 sample pills.  I instruct     him to discontinue if he has any type of allergic reaction.  4. Continue Norco 7.5 mg one-half to one p.o. t.i.d. as needed, #50 without     refills.  5. Consider physical therapy and possibly even lumbar epidural steroid     injections as predicated upon the patient's response, symptoms, and     imaging studies.  6. The patient is to return to the clinic in two weeks for reevaluation.   The patient was educated on the above findings and recommendations and  understands.  There were no barriers to communication.                                               Zachary George, DO    JW/MEDQ  D:  07/13/2002  T:  07/13/2002  Job:  [id number]   cc:   Excell Seltzer. Annabell Howells, M.D.  [address], [address]  [place]  [place] [address]  Fax: [phone]

## 2014-02-08 ENCOUNTER — Ambulatory Visit (INDEPENDENT_AMBULATORY_CARE_PROVIDER_SITE_OTHER): Payer: BC Managed Care – PPO

## 2014-02-08 ENCOUNTER — Ambulatory Visit (INDEPENDENT_AMBULATORY_CARE_PROVIDER_SITE_OTHER): Payer: BC Managed Care – PPO | Admitting: Internal Medicine

## 2014-02-08 VITALS — BP 108/74 | HR 83 | Temp 98.2°F | Resp 18 | Ht 70.0 in | Wt 160.4 lb

## 2014-02-08 DIAGNOSIS — L02419 Cutaneous abscess of limb, unspecified: Secondary | ICD-10-CM

## 2014-02-08 DIAGNOSIS — L03115 Cellulitis of right lower limb: Secondary | ICD-10-CM

## 2014-02-08 DIAGNOSIS — M25569 Pain in unspecified knee: Secondary | ICD-10-CM

## 2014-02-08 DIAGNOSIS — S60012A Contusion of left thumb without damage to nail, initial encounter: Secondary | ICD-10-CM

## 2014-02-08 DIAGNOSIS — S6000XA Contusion of unspecified finger without damage to nail, initial encounter: Secondary | ICD-10-CM

## 2014-02-08 DIAGNOSIS — M25561 Pain in right knee: Secondary | ICD-10-CM

## 2014-02-08 DIAGNOSIS — L03119 Cellulitis of unspecified part of limb: Secondary | ICD-10-CM

## 2014-02-08 MED ORDER — DOXYCYCLINE HYCLATE 100 MG PO TABS: 100.0000 mg | ORAL_TABLET | Freq: Two times a day (BID) | ORAL | Status: DC

## 2014-02-08 NOTE — Progress Notes (Signed)
   Subjective:  This chart was scribed for Tonye Pearson, MD by Charline Bills, ED Scribe. The patient was seen in room 8. Patient's care was started at 8:42 PM.   Patient ID: Jared Robinson, male    DOB: 1971-06-29, 43 y.o.   MRN: [id number]  Chief Complaint  Patient presents with  . Knee Pain    Pain and swelling rt knee across thigh   . Hand Pain    lt thumb hit with hammer  week ago  previous trauma to thumb   HPI HPI Comments: Jared Robinson is a 43 y.o. male who presents to the [facility] complaining of R knee pain that radiates up R thigh onset 2-3 days ago. Pt states that he hurt his R knee a few years ago without orthopedist follow up. Pt reports what he suspected was an ingrown hair to his R knee while working on his knees on carpet. Pt noted associated swelling to his R knee today and swollen lymph nodes in groin. Pt states that his coworker currently has MRSA.  Pt also reports previous trauma to L thumb. He states that he hit his thumb with a hammer 1 week ago. He states that his finger was purple when the incident occurred which has now resolved. Pt also reports a cold sensation to his L thumb. Pt reports loss of sensation to L thumb since initial trauma yeas ago.   Pt works as a Music therapist.   No past medical history on file. No current outpatient prescriptions on file prior to visit.   No current facility-administered medications on file prior to visit.   Allergies  Allergen Reactions  . Aspirin     Told he had rash as child   Review of Systems  Musculoskeletal: Positive for arthralgias and joint swelling.  Skin: Positive for wound.  Neurological: Positive for numbness (prior).      Objective:   Physical Exam  Constitutional: He is oriented to person, place, and time. He appears well-developed and well-nourished.  Eyes: Conjunctivae are normal.  Neck: Neck supple.  Musculoskeletal:  R leg:  Area of cellulitis on medial aspect to knee  with central pustule but no abscess The knee is stable to examination The area of erythema is 3 cm by 5 cm and is tender  L thumb: Is disfigured from prior injury and surgery with tenderness over proximal pharynx with mild swelling He does have weakness of grip but this is also a chronic problem since his surgery  Neurological: He is alert and oriented to person, place, and time.  XR of R thumbs shows chronic changes of prior injury with surgery infusion of joint and no sign of new fracture     Assessment & Plan:   I personally performed the services described in this documentation, which was scribed in my presence. The recorded information has been reviewed and is accurate.  Right knee pain /-Cellulitis of leg   Plan: Wound culture/doxy  Contusion of left thumb, initial encounter -reassured should improve to former status with rest and rom--ortho consult if not

## 2014-02-11 LAB — WOUND CULTURE
GRAM STAIN: NONE SEEN
Gram Stain: NONE SEEN

## 2014-02-12 ENCOUNTER — Telehealth: Payer: Self-pay

## 2014-02-12 NOTE — Telephone Encounter (Signed)
Patient notified lab results. See labs

## 2014-02-12 NOTE — Telephone Encounter (Signed)
Patient called regarding lab results. Patient states no one has called him yet. CB # [phone]

## 2014-02-12 NOTE — Telephone Encounter (Signed)
Please advise lab results.

## 2015-10-11 ENCOUNTER — Other Ambulatory Visit: Payer: Self-pay | Admitting: Internal Medicine

## 2015-10-11 ENCOUNTER — Ambulatory Visit
Admission: RE | Admit: 2015-10-11 | Discharge: 2015-10-11 | Disposition: A | Discharge status: Home / Self Care | Payer: No Typology Code available for payment source | Source: Ambulatory Visit | Attending: Internal Medicine | Admitting: Internal Medicine

## 2015-10-11 DIAGNOSIS — R079 Chest pain, unspecified: Secondary | ICD-10-CM

## 2015-10-18 ENCOUNTER — Other Ambulatory Visit: Payer: Self-pay | Admitting: Internal Medicine

## 2015-10-18 DIAGNOSIS — R9389 Abnormal findings on diagnostic imaging of other specified body structures: Secondary | ICD-10-CM

## 2015-10-24 ENCOUNTER — Ambulatory Visit
Admission: RE | Admit: 2015-10-24 | Discharge: 2015-10-24 | Disposition: A | Discharge status: Home / Self Care | Payer: No Typology Code available for payment source | Source: Ambulatory Visit | Attending: Internal Medicine | Admitting: Internal Medicine

## 2015-10-24 DIAGNOSIS — R9389 Abnormal findings on diagnostic imaging of other specified body structures: Secondary | ICD-10-CM

## 2016-10-10 DIAGNOSIS — K649 Unspecified hemorrhoids: Secondary | ICD-10-CM | POA: Diagnosis not present

## 2016-11-12 ENCOUNTER — Ambulatory Visit (HOSPITAL_COMMUNITY)
Admission: EM | Admit: 2016-11-12 | Discharge: 2016-11-12 | Disposition: A | Discharge status: Home / Self Care | Payer: BLUE CROSS/BLUE SHIELD | Attending: Internal Medicine | Admitting: Internal Medicine

## 2016-11-12 ENCOUNTER — Ambulatory Visit (INDEPENDENT_AMBULATORY_CARE_PROVIDER_SITE_OTHER): Payer: BLUE CROSS/BLUE SHIELD

## 2016-11-12 ENCOUNTER — Encounter (HOSPITAL_COMMUNITY): Payer: Self-pay | Admitting: Emergency Medicine

## 2016-11-12 DIAGNOSIS — K5900 Constipation, unspecified: Secondary | ICD-10-CM | POA: Diagnosis not present

## 2016-11-12 DIAGNOSIS — K5909 Other constipation: Secondary | ICD-10-CM | POA: Diagnosis not present

## 2016-11-12 DIAGNOSIS — R1030 Lower abdominal pain, unspecified: Secondary | ICD-10-CM

## 2016-11-12 DIAGNOSIS — K648 Other hemorrhoids: Secondary | ICD-10-CM | POA: Diagnosis not present

## 2016-11-12 DIAGNOSIS — R109 Unspecified abdominal pain: Secondary | ICD-10-CM | POA: Diagnosis not present

## 2016-11-12 DIAGNOSIS — R110 Nausea: Secondary | ICD-10-CM | POA: Diagnosis not present

## 2016-11-12 DIAGNOSIS — R21 Rash and other nonspecific skin eruption: Secondary | ICD-10-CM

## 2016-11-12 MED ORDER — NYSTATIN 100000 UNIT/GM EX CREA: TOPICAL_CREAM | CUTANEOUS | 1 refills | Status: DC

## 2016-11-12 MED ORDER — POLYETHYLENE GLYCOL 3350 17 G PO PACK: 17.0000 g | PACK | Freq: Every day | ORAL | 0 refills | Status: DC

## 2016-11-12 MED ORDER — HYDROCORTISONE ACETATE 25 MG RE SUPP: 25.0000 mg | Freq: Two times a day (BID) | RECTAL | 1 refills | Status: DC

## 2016-11-12 MED ORDER — HYDROCORTISONE 2.5 % RE CREA: TOPICAL_CREAM | RECTAL | 1 refills | Status: DC

## 2016-11-12 NOTE — ED Provider Notes (Signed)
CSN: [id number]     Arrival date & time 11/12/16  1709 History   None    Chief Complaint  Patient presents with  . Hemorrhoids   (Consider location/radiation/quality/duration/timing/severity/associated sxs/prior Treatment) Patient c/o constipation, internal and external hemorrhoids, rash on his perineum, and severe anal pruritis.     The history is provided by the patient.  Rash  Location: perineum. Quality: itchiness and redness   Severity:  Moderate Onset quality:  Sudden Duration:  3 days Timing:  Constant Progression:  Worsening Chronicity:  New Relieved by:  Nothing Worsened by:  Nothing   History reviewed. No pertinent past medical history. History reviewed. No pertinent surgical history. History reviewed. No pertinent family history. Social History  Substance Use Topics  . Smoking status: Current Every Day Smoker    Packs/day: 1.00    Types: Cigarettes  . Smokeless tobacco: Never Used  . Alcohol use No    Review of Systems  Constitutional: Negative.   HENT: Negative.   Eyes: Negative.   Respiratory: Negative.   Cardiovascular: Negative.   Gastrointestinal: Positive for constipation and rectal pain.  Endocrine: Negative.   Genitourinary: Negative.   Musculoskeletal: Negative.   Skin: Positive for rash.  Allergic/Immunologic: Negative.   Neurological: Negative.   Hematological: Negative.   Psychiatric/Behavioral: Negative.     Allergies  Aspirin  Home Medications   Prior to Admission medications   Medication Sig Start Date End Date Taking? Authorizing Provider  hydrocortisone (ANUSOL-HC) 2.5 % rectal cream Apply rectally 2 times daily 11/12/16   Deatra Canter, FNP  hydrocortisone (ANUSOL-HC) 25 MG suppository Place 1 suppository (25 mg total) rectally 2 (two) times daily. 11/12/16   Deatra Canter, FNP  nystatin cream (MYCOSTATIN) Apply to affected area 2 times daily 11/12/16   Deatra Canter, FNP  polyethylene glycol [facility] / GLYCOLAX)  packet Take 17 g by mouth daily. 11/12/16   Deatra Canter, FNP   Meds Ordered and Administered this Visit  Medications - No data to display  BP 134/89 (BP Location: Right Arm)   Pulse 96   Temp 98.7 F (37.1 C) (Oral)   Resp 16   SpO2 99%  No data found.   Physical Exam  Constitutional: He is oriented to person, place, and time. He appears well-developed and well-nourished.  HENT:  Head: Normocephalic and atraumatic.  Right Ear: External ear normal.  Left Ear: External ear normal.  Mouth/Throat: Oropharynx is clear and moist.  Eyes: Conjunctivae and EOM are normal. Pupils are equal, round, and reactive to light.  Neck: Normal range of motion. Neck supple.  Cardiovascular: Normal rate, regular rhythm and normal heart sounds.   Pulmonary/Chest: Effort normal and breath sounds normal.  Abdominal: Soft. Bowel sounds are normal.  Genitourinary:  Genitourinary Comments: Anus with chafing and erythema Rectal with good tone and discomfort, palpated internal hemorrhoids, no mass.   Neurological: He is alert and oriented to person, place, and time.  Skin:  Perineum with erythema  Nursing note and vitals reviewed.   Urgent Care Course     Procedures (including critical care time)  Labs Review Labs Reviewed - No data to display  Imaging Review Dg Abd 1 View  Result Date: 11/12/2016 CLINICAL DATA:  Abdominal pain x6 weeks with constipation and nausea. History of laminectomy. EXAM: ABDOMEN - 1 VIEW COMPARISON:  None. FINDINGS: The bowel gas pattern is normal. An average amount of fecal residue is seen within large bowel. A calcification most likely representing a phlebolith is  noted in the right lower pelvis. A punctate calcification in the region of the right renal pelvis may represent a tiny calculus. No radio-opaque calculi or other significant radiographic abnormality are seen. No acute osseous appearing abnormality. IMPRESSION: Punctate calcification in the region of the right  renal pelvis may represent a tiny renal stone. Unremarkable bowel gas pattern. Electronically Signed   By: Tollie Eth M.D.   On: 11/12/2016 18:45     Visual Acuity Review  Right Eye Distance:   Left Eye Distance:   Bilateral Distance:    Right Eye Near:   Left Eye Near:    Bilateral Near:         MDM   1. Internal hemorrhoids   2. Rash of perineum   3. Constipation, unspecified constipation type    anusol HC suppositories and cream Nystatin Cream for perineum Miralax for constipation      Deatra Canter, FNP 11/12/16 1920

## 2016-11-12 NOTE — ED Triage Notes (Signed)
The patient presented to the [facility] with a complaint of a hemorrhoid that started bothering him 11 weeks ago after moving a large object. The patient stated that he has been seen by his PCP and was prescribed a cream that he stated is not working.

## 2016-12-12 ENCOUNTER — Ambulatory Visit (HOSPITAL_COMMUNITY)
Admission: EM | Admit: 2016-12-12 | Discharge: 2016-12-12 | Disposition: A | Discharge status: Home / Self Care | Payer: BLUE CROSS/BLUE SHIELD | Attending: Family Medicine | Admitting: Family Medicine

## 2016-12-12 ENCOUNTER — Encounter (HOSPITAL_COMMUNITY): Payer: Self-pay | Admitting: *Deleted

## 2016-12-12 DIAGNOSIS — K5900 Constipation, unspecified: Secondary | ICD-10-CM | POA: Diagnosis not present

## 2016-12-12 DIAGNOSIS — S0990XA Unspecified injury of head, initial encounter: Secondary | ICD-10-CM | POA: Diagnosis not present

## 2016-12-12 DIAGNOSIS — S0101XA Laceration without foreign body of scalp, initial encounter: Secondary | ICD-10-CM | POA: Diagnosis not present

## 2016-12-12 DIAGNOSIS — K648 Other hemorrhoids: Secondary | ICD-10-CM

## 2016-12-12 MED ORDER — POLYETHYLENE GLYCOL 3350 17 G PO PACK: 17.0000 g | PACK | Freq: Two times a day (BID) | ORAL | 1 refills | Status: DC

## 2016-12-12 MED ORDER — HYDROCORTISONE 2.5 % RE CREA: TOPICAL_CREAM | RECTAL | 2 refills | Status: DC

## 2016-12-12 MED ORDER — DOCUSATE SODIUM 100 MG PO CAPS: 100.0000 mg | ORAL_CAPSULE | Freq: Two times a day (BID) | ORAL | 1 refills | Status: DC

## 2016-12-12 NOTE — ED Triage Notes (Signed)
Patient states he was struck by a piece of lumbar just prior to arrival on head. Denies dizziness or LOC. Patient reports mild headache. Bleeding controlled at this time.

## 2016-12-12 NOTE — ED Provider Notes (Signed)
CSN: [id number]     Arrival date & time 12/12/16  1529 History   None    Chief Complaint  Patient presents with  . Head Laceration   (Consider location/radiation/quality/duration/timing/severity/associated sxs/prior Treatment) Laceration to scalp today when he was struck on head.  Denies LOC or dizziness.  C/o constipation and anal pruritis.  His TD is up to date.   The history is provided by the patient.  Head Laceration  This is a new problem. The problem occurs constantly. The problem has not changed since onset.Nothing aggravates the symptoms.    History reviewed. No pertinent past medical history. History reviewed. No pertinent surgical history. History reviewed. No pertinent family history. Social History  Substance Use Topics  . Smoking status: Current Every Day Smoker    Packs/day: 1.00    Types: Cigarettes  . Smokeless tobacco: Never Used  . Alcohol use No    Review of Systems  Constitutional: Negative.   HENT: Negative.   Eyes: Negative.   Respiratory: Negative.   Cardiovascular: Negative.   Gastrointestinal: Positive for constipation and rectal pain.  Endocrine: Negative.   Genitourinary: Negative.   Musculoskeletal: Negative.   Skin: Positive for wound.  Allergic/Immunologic: Negative.   Neurological: Negative.   Hematological: Negative.   Psychiatric/Behavioral: Negative.     Allergies  Aspirin  Home Medications   Prior to Admission medications   Medication Sig Start Date End Date Taking? Authorizing Provider  docusate sodium (COLACE) 100 MG capsule Take 1 capsule (100 mg total) by mouth every 12 (twelve) hours. 12/12/16   Deatra Canter, FNP  hydrocortisone (ANUSOL-HC) 2.5 % rectal cream Apply rectally 2 times daily 12/12/16   Deatra Canter, FNP  hydrocortisone (ANUSOL-HC) 25 MG suppository Place 1 suppository (25 mg total) rectally 2 (two) times daily. 11/12/16   Deatra Canter, FNP  nystatin cream (MYCOSTATIN) Apply to affected area 2  times daily 11/12/16   Deatra Canter, FNP  polyethylene glycol [facility] / Ethelene Hal) packet Take 17 g by mouth 2 (two) times daily. 12/12/16   Deatra Canter, FNP   Meds Ordered and Administered this Visit  Medications - No data to display  There were no vitals taken for this visit. No data found.   Physical Exam  Constitutional: He is oriented to person, place, and time. He appears well-developed and well-nourished.  HENT:  Head: Normocephalic and atraumatic.  Eyes: Conjunctivae and EOM are normal. Pupils are equal, round, and reactive to light.  Neck: Normal range of motion. Neck supple.  Cardiovascular: Normal rate, regular rhythm and normal heart sounds.   Pulmonary/Chest: Effort normal and breath sounds normal.  Abdominal: Soft. Bowel sounds are normal.  Neurological: He is alert and oriented to person, place, and time.  Skin:  Right parietal scalp with 3 inch laceration.    Nursing note and vitals reviewed.   Urgent Care Course     .Marland KitchenLaceration Repair Date/Time: 12/12/2016 5:18 PM Performed by: Deatra Canter Authorized by: Elvina Sidle   Consent:    Consent obtained:  Verbal   Consent given by:  Patient   Risks discussed:  Infection and pain   Alternatives discussed:  No treatment Anesthesia (see MAR for exact dosages):    Anesthesia method:  Local infiltration   Local anesthetic:  Lidocaine 1% WITH epi Laceration details:    Location:  Scalp   Length (cm):  6   Depth (mm):  3 Pre-procedure details:    Preparation:  Patient was prepped and draped  in usual sterile fashion Exploration:    Hemostasis achieved with:  Direct pressure   Wound exploration: wound explored through full range of motion     Contaminated: no   Treatment:    Area cleansed with:  Betadine   Amount of cleaning:  Standard   Irrigation solution:  Sterile saline   Irrigation method:  Syringe   Visualized foreign bodies/material removed: no   Skin repair:    Repair method:   Staples   Number of staples:  5 Approximation:    Approximation:  Close Post-procedure details:    Dressing:  Antibiotic ointment   Patient tolerance of procedure:  Tolerated well, no immediate complications   (including critical care time)  Labs Review Labs Reviewed - No data to display  Imaging Review No results found.   Visual Acuity Review  Right Eye Distance:   Left Eye Distance:   Bilateral Distance:    Right Eye Near:   Left Eye Near:    Bilateral Near:         MDM   1. Laceration of scalp without foreign body, initial encounter   2. Injury of head, initial encounter   3. Constipation, unspecified constipation type   4. Other hemorrhoids    5 staples to laceration on scalp.  Recommend follow up in 7-10 days.    Anusol cream bid Colace 100mg  one po bid #60 Miralax 17 grams bid      Deatra Canter, [place] 12/12/16 1719

## 2017-07-16 ENCOUNTER — Encounter (HOSPITAL_COMMUNITY): Payer: Self-pay

## 2017-07-16 ENCOUNTER — Emergency Department (HOSPITAL_COMMUNITY): Payer: BLUE CROSS/BLUE SHIELD

## 2017-07-16 ENCOUNTER — Observation Stay (HOSPITAL_COMMUNITY)
Admission: EM | Admit: 2017-07-16 | Discharge: 2017-07-19 | Disposition: A | Discharge status: Other Acute Inpatient Hospital | Payer: BLUE CROSS/BLUE SHIELD | Attending: Internal Medicine | Admitting: Internal Medicine

## 2017-07-16 ENCOUNTER — Other Ambulatory Visit: Payer: Self-pay

## 2017-07-16 DIAGNOSIS — K589 Irritable bowel syndrome without diarrhea: Secondary | ICD-10-CM | POA: Diagnosis not present

## 2017-07-16 DIAGNOSIS — F1910 Other psychoactive substance abuse, uncomplicated: Secondary | ICD-10-CM | POA: Diagnosis present

## 2017-07-16 DIAGNOSIS — R45851 Suicidal ideations: Secondary | ICD-10-CM | POA: Diagnosis not present

## 2017-07-16 DIAGNOSIS — Z79899 Other long term (current) drug therapy: Secondary | ICD-10-CM | POA: Insufficient documentation

## 2017-07-16 DIAGNOSIS — J449 Chronic obstructive pulmonary disease, unspecified: Secondary | ICD-10-CM | POA: Insufficient documentation

## 2017-07-16 DIAGNOSIS — R079 Chest pain, unspecified: Secondary | ICD-10-CM | POA: Insufficient documentation

## 2017-07-16 DIAGNOSIS — R4689 Other symptoms and signs involving appearance and behavior: Secondary | ICD-10-CM

## 2017-07-16 DIAGNOSIS — F1410 Cocaine abuse, uncomplicated: Principal | ICD-10-CM | POA: Insufficient documentation

## 2017-07-16 DIAGNOSIS — R45850 Homicidal ideations: Secondary | ICD-10-CM | POA: Insufficient documentation

## 2017-07-16 DIAGNOSIS — I248 Other forms of acute ischemic heart disease: Secondary | ICD-10-CM | POA: Insufficient documentation

## 2017-07-16 DIAGNOSIS — R002 Palpitations: Secondary | ICD-10-CM | POA: Insufficient documentation

## 2017-07-16 DIAGNOSIS — R4589 Other symptoms and signs involving emotional state: Secondary | ICD-10-CM | POA: Diagnosis present

## 2017-07-16 DIAGNOSIS — F17210 Nicotine dependence, cigarettes, uncomplicated: Secondary | ICD-10-CM | POA: Insufficient documentation

## 2017-07-16 DIAGNOSIS — Z886 Allergy status to analgesic agent status: Secondary | ICD-10-CM | POA: Diagnosis not present

## 2017-07-16 DIAGNOSIS — I509 Heart failure, unspecified: Secondary | ICD-10-CM

## 2017-07-16 DIAGNOSIS — R0602 Shortness of breath: Secondary | ICD-10-CM | POA: Diagnosis not present

## 2017-07-16 DIAGNOSIS — F4325 Adjustment disorder with mixed disturbance of emotions and conduct: Secondary | ICD-10-CM | POA: Diagnosis present

## 2017-07-16 DIAGNOSIS — I5021 Acute systolic (congestive) heart failure: Secondary | ICD-10-CM | POA: Diagnosis not present

## 2017-07-16 DIAGNOSIS — F1210 Cannabis abuse, uncomplicated: Secondary | ICD-10-CM | POA: Diagnosis not present

## 2017-07-16 DIAGNOSIS — I071 Rheumatic tricuspid insufficiency: Secondary | ICD-10-CM | POA: Diagnosis not present

## 2017-07-16 DIAGNOSIS — R072 Precordial pain: Secondary | ICD-10-CM | POA: Diagnosis present

## 2017-07-16 HISTORY — DX: Chronic obstructive pulmonary disease, unspecified: J44.9

## 2017-07-16 HISTORY — DX: Heart failure, unspecified: I50.9

## 2017-07-16 HISTORY — DX: Personal history of other medical treatment: Z92.89

## 2017-07-16 HISTORY — DX: Anxiety disorder, unspecified: F41.9

## 2017-07-16 HISTORY — DX: Irritable bowel syndrome, unspecified: K58.9

## 2017-07-16 HISTORY — DX: Low back pain, unspecified: M54.50

## 2017-07-16 HISTORY — DX: Low back pain: M54.5

## 2017-07-16 HISTORY — DX: Unspecified chronic bronchitis: J42

## 2017-07-16 HISTORY — PX: TRANSTHORACIC ECHOCARDIOGRAM: SHX275

## 2017-07-16 HISTORY — DX: Other chronic pain: G89.29

## 2017-07-16 LAB — COMPREHENSIVE METABOLIC PANEL
ALBUMIN: 4.7 g/dL (ref 3.5–5.0)
ALK PHOS: 59 U/L (ref 38–126)
ALT: 16 U/L — AB (ref 17–63)
AST: 21 U/L (ref 15–41)
Anion gap: 11 (ref 5–15)
BUN: 6 mg/dL (ref 6–20)
CALCIUM: 9.5 mg/dL (ref 8.9–10.3)
CHLORIDE: 100 mmol/L — AB (ref 101–111)
CO2: 26 mmol/L (ref 22–32)
CREATININE: 1.03 mg/dL (ref 0.61–1.24)
GFR calc Af Amer: >60 mL/min (ref >60)
GFR calc non Af Amer: >60 mL/min (ref >60)
GLUCOSE: 91 mg/dL (ref 65–99)
Potassium: 4.4 mmol/L (ref 3.5–5.1)
Sodium: 137 mmol/L (ref 135–145)
Total Bilirubin: 0.9 mg/dL (ref 0.3–1.2)
Total Protein: 7.8 g/dL (ref 6.5–8.1)

## 2017-07-16 LAB — MAGNESIUM: MAGNESIUM: 2.1 mg/dL (ref 1.7–2.4)

## 2017-07-16 LAB — CBC
HCT: 45.6 % (ref 39.0–52.0)
HEMATOCRIT: 51.3 % (ref 39.0–52.0)
HEMOGLOBIN: 17.4 g/dL — AB (ref 13.0–17.0)
Hemoglobin: 15.9 g/dL (ref 13.0–17.0)
MCH: 30.5 pg (ref 26.0–34.0)
MCH: 31 pg (ref 26.0–34.0)
MCHC: 33.9 g/dL (ref 30.0–36.0)
MCHC: 34.9 g/dL (ref 30.0–36.0)
MCV: 89.8 fL (ref 78.0–100.0)
MCV: 88.9 fL (ref 78.0–100.0)
PLATELETS: 250 10*3/uL (ref 150–400)
Platelets: 301 10*3/uL (ref 150–400)
RBC: 5.71 MIL/uL (ref 4.22–5.81)
RBC: 5.13 MIL/uL (ref 4.22–5.81)
RDW: 13.7 % (ref 11.5–15.5)
RDW: 13.7 % (ref 11.5–15.5)
WBC: 9.6 10*3/uL (ref 4.0–10.5)
WBC: 7.5 10*3/uL (ref 4.0–10.5)

## 2017-07-16 LAB — RAPID URINE DRUG SCREEN, HOSP PERFORMED
AMPHETAMINES: NOT DETECTED
BARBITURATES: NOT DETECTED
BENZODIAZEPINES: NOT DETECTED
COCAINE: POSITIVE — AB
Opiates: NOT DETECTED
Tetrahydrocannabinol: POSITIVE — AB

## 2017-07-16 LAB — CREATININE, SERUM
Creatinine, Ser: 1.05 mg/dL (ref 0.61–1.24)
GFR calc Af Amer: >60 mL/min (ref >60)
GFR calc non Af Amer: >60 mL/min (ref >60)

## 2017-07-16 LAB — HEPATIC FUNCTION PANEL
ALBUMIN: 3.9 g/dL (ref 3.5–5.0)
ALT: 14 U/L — ABNORMAL LOW (ref 17–63)
AST: 20 U/L (ref 15–41)
Alkaline Phosphatase: 52 U/L (ref 38–126)
Bilirubin, Direct: 0.1 mg/dL (ref 0.1–0.5)
Indirect Bilirubin: 0.9 mg/dL (ref 0.3–0.9)
TOTAL PROTEIN: 6.7 g/dL (ref 6.5–8.1)
Total Bilirubin: 1 mg/dL (ref 0.3–1.2)

## 2017-07-16 LAB — TSH: TSH: 3.954 u[IU]/mL (ref 0.350–4.500)

## 2017-07-16 LAB — I-STAT TROPONIN, ED
Troponin i, poc: 0.01 ng/mL (ref 0.00–0.08)
Troponin i, poc: 0 ng/mL (ref 0.00–0.08)

## 2017-07-16 LAB — TROPONIN I: Troponin I: <0.03 ng/mL (ref <0.03)

## 2017-07-16 LAB — SALICYLATE LEVEL: Salicylate Lvl: <7 mg/dL (ref 2.8–30.0)

## 2017-07-16 LAB — ETHANOL: Alcohol, Ethyl (B): <10 mg/dL (ref <10)

## 2017-07-16 LAB — ACETAMINOPHEN LEVEL

## 2017-07-16 MED ORDER — VITAMIN B-1 100 MG PO TABS
100.0000 mg | ORAL_TABLET | Freq: Every day | ORAL | Status: DC
  Administered 2017-07-17 – 2017-07-19 (×3): 100 mg via ORAL
  Filled 2017-07-16 (×3): qty 1

## 2017-07-16 MED ORDER — ENOXAPARIN SODIUM 40 MG/0.4ML ~~LOC~~ SOLN
40.0000 mg | SUBCUTANEOUS | Status: DC
  Administered 2017-07-17: 40 mg via SUBCUTANEOUS
  Filled 2017-07-16 (×2): qty 0.4

## 2017-07-16 MED ORDER — NICOTINE 21 MG/24HR TD PT24
21.0000 mg | MEDICATED_PATCH | Freq: Every day | TRANSDERMAL | Status: DC
  Administered 2017-07-16 – 2017-07-19 (×4): 21 mg via TRANSDERMAL
  Filled 2017-07-16 (×4): qty 1

## 2017-07-16 MED ORDER — NITROGLYCERIN 0.4 MG SL SUBL
0.4000 mg | SUBLINGUAL_TABLET | SUBLINGUAL | Status: DC | PRN
  Administered 2017-07-17 – 2017-07-19 (×2): 0.4 mg via SUBLINGUAL
  Filled 2017-07-16 (×2): qty 1

## 2017-07-16 MED ORDER — LORAZEPAM 2 MG/ML IJ SOLN: 0.0000 mg | Freq: Two times a day (BID) | INTRAMUSCULAR | Status: DC

## 2017-07-16 MED ORDER — LORAZEPAM 2 MG/ML IJ SOLN
0.0000 mg | Freq: Four times a day (QID) | INTRAMUSCULAR | Status: AC
  Administered 2017-07-16: 1 mg via INTRAVENOUS
  Filled 2017-07-16: qty 1

## 2017-07-16 MED ORDER — GI COCKTAIL ~~LOC~~
30.0000 mL | Freq: Once | ORAL | Status: AC
  Administered 2017-07-16: 30 mL via ORAL
  Filled 2017-07-16: qty 30

## 2017-07-16 MED ORDER — ONDANSETRON HCL 4 MG PO TABS: 4.0000 mg | ORAL_TABLET | Freq: Four times a day (QID) | ORAL | Status: DC | PRN

## 2017-07-16 MED ORDER — ACETAMINOPHEN 650 MG RE SUPP
650.0000 mg | Freq: Four times a day (QID) | RECTAL | Status: DC | PRN
Start: 2017-07-16 — End: 2017-07-19

## 2017-07-16 MED ORDER — ONDANSETRON HCL 4 MG/2ML IJ SOLN: 4.0000 mg | Freq: Four times a day (QID) | INTRAMUSCULAR | Status: DC | PRN

## 2017-07-16 MED ORDER — LORAZEPAM 1 MG PO TABS
0.0000 mg | ORAL_TABLET | Freq: Four times a day (QID) | ORAL | Status: AC
  Administered 2017-07-17 – 2017-07-18 (×3): 1 mg via ORAL
  Filled 2017-07-16 (×3): qty 1

## 2017-07-16 MED ORDER — THIAMINE HCL 100 MG/ML IJ SOLN
100.0000 mg | Freq: Every day | INTRAMUSCULAR | Status: DC
  Filled 2017-07-16: qty 2

## 2017-07-16 MED ORDER — ACETAMINOPHEN 325 MG PO TABS: 650.0000 mg | ORAL_TABLET | Freq: Four times a day (QID) | ORAL | Status: DC | PRN

## 2017-07-16 MED ORDER — LORAZEPAM 1 MG PO TABS: 0.0000 mg | ORAL_TABLET | Freq: Two times a day (BID) | ORAL | Status: DC

## 2017-07-16 NOTE — ED Notes (Signed)
Staffing Office aware of need for Sitter. 

## 2017-07-16 NOTE — H&P (Addendum)
History and Physical    Jared Robinson ZOX:[id number] DOB: Jan 23, 1971 DOA: 07/16/2017  PCP: Jared Apley, MD  Patient coming from: Home.  Chief Complaint: Chest pain.  HPI: Jared Robinson is a 47 y.o. male with history of polysubstance abuse including cocaine alcohol and tobacco presents to the ER after patient started having more recurrent chest pain than usual.  Patient states that over the last 2 months patient has been having chest pain off and on but since this morning it became more frequent than usual.  Has palpitations along with it.  Pain is retrosternal stabbing in nature last for a few minutes this time no associated symptoms with exertion or any shortness of breath fever chills or productive cough.  Patient admits to have taken cocaine at 2 AM chest pain started around 4 AM.  In addition patient admits to be having suicidal ideation due to family issues.  Patient denies having taken any other medications.  ED Course: In the ER patient was asked with chest pain so nitroglycerin has been ordered.  EKG was showing normal sinus rhythm with nonspecific changes with chest x-ray showing COPD findings.  Patient is placed on suicide precaution and admitted for further workup of chest pain.  Review of Systems: As per HPI, rest all negative.   Past Medical History:  Diagnosis Date  . COPD (chronic obstructive pulmonary disease) (HCC)   . IBS (irritable bowel syndrome)     Past Surgical History:  Procedure Laterality Date  . HAND SURGERY    . HERNIA REPAIR    . LUMBAR LAMINECTOMY       reports that he has been smoking cigarettes.  He has been smoking about 1.00 pack per day. he has never used smokeless tobacco. He reports that he drinks alcohol. He reports that he uses drugs. Drugs: Cocaine and Marijuana.  Allergies  Allergen Reactions  . Aspirin Rash    Family History  Problem Relation Age of Onset  . Hypertension Other     Prior to Admission medications   Not on File     Physical Exam: Vitals:   07/16/17 1309  BP: (!) 135/99  Pulse: (!) 116  Resp: 17  Temp: 97.9 F (36.6 C)  TempSrc: Oral  SpO2: 100%      Constitutional: Moderately built and nourished. Vitals:   07/16/17 1309  BP: (!) 135/99  Pulse: (!) 116  Resp: 17  Temp: 97.9 F (36.6 C)  TempSrc: Oral  SpO2: 100%   Eyes: Anicteric no pallor. ENMT: No discharge from the ears eyes nose or mouth. Neck: No mass felt.  No neck rigidity. Respiratory: No rhonchi or crepitations. Cardiovascular: S1-S2 heard tachycardic. Abdomen: Soft nontender bowel sounds present. Musculoskeletal: No edema.  No joint effusion. Skin: No rash.  Skin appears warm. Neurologic: Alert awake oriented to time place and person.  Moves all extremities. Psychiatric: Appears normal.  Normal affect.   Labs on Admission: I have personally reviewed following labs and imaging studies  CBC: Recent Labs  Lab 07/16/17 1324  WBC 9.6  HGB 17.4*  HCT 51.3  MCV 89.8  PLT 301   Basic Metabolic Panel: Recent Labs  Lab 07/16/17 1330  NA 137  K 4.4  CL 100*  CO2 26  GLUCOSE 91  BUN 6  CREATININE 1.03  CALCIUM 9.5   GFR: CrCl cannot be calculated (Unknown ideal weight.). Liver Function Tests: Recent Labs  Lab 07/16/17 1330  AST 21  ALT 16*  ALKPHOS 59  BILITOT 0.9  PROT 7.8  ALBUMIN 4.7   No results for input(s): LIPASE, AMYLASE in the last 168 hours. No results for input(s): AMMONIA in the last 168 hours. Coagulation Profile: No results for input(s): INR, PROTIME in the last 168 hours. Cardiac Enzymes: No results for input(s): CKTOTAL, CKMB, CKMBINDEX, TROPONINI in the last 168 hours. BNP (last 3 results) No results for input(s): PROBNP in the last 8760 hours. HbA1C: No results for input(s): HGBA1C in the last 72 hours. CBG: No results for input(s): GLUCAP in the last 168 hours. Lipid Profile: No results for input(s): CHOL, HDL, LDLCALC, TRIG, CHOLHDL, LDLDIRECT in the last 72  hours. Thyroid Function Tests: No results for input(s): TSH, T4TOTAL, FREET4, T3FREE, THYROIDAB in the last 72 hours. Anemia Panel: No results for input(s): VITAMINB12, FOLATE, FERRITIN, TIBC, IRON, RETICCTPCT in the last 72 hours. Urine analysis: No results found for: COLORURINE, APPEARANCEUR, LABSPEC, PHURINE, GLUCOSEU, HGBUR, BILIRUBINUR, KETONESUR, PROTEINUR, UROBILINOGEN, NITRITE, LEUKOCYTESUR Sepsis Labs: @LABRCNTIP (procalcitonin:4,lacticidven:4) )No results found for this or any previous visit (from the past 240 hour(s)).   Radiological Exams on Admission: Dg Chest 2 View  Result Date: 07/16/2017 CLINICAL DATA:  Chest pain for the past 2 days. Intermittent shortness of breath. EXAM: CHEST  2 VIEW COMPARISON:  Chest x-ray dated October 11, 2015. FINDINGS: The cardiomediastinal silhouette is normal in size. Normal pulmonary vascularity. The lungs remain hyperinflated. No focal consolidation, pleural effusion, or pneumothorax. No acute osseous abnormality. IMPRESSION: COPD.  No active cardiopulmonary disease. Electronically Signed   By: Obie Dredge M.D.   On: 07/16/2017 14:05    EKG: Independently reviewed.  Normal sinus rhythm.  Assessment/Plan Principal Problem:   Chest pain Active Problems:   Polysubstance abuse (HCC)   Suicidal behavior    1. Chest pain -likely precipitated by cocaine abuse.  We will cycle cardiac markers check 2D echo.  Patient is allergic to aspirin.  Nitroglycerin.  Since patient had palpitations we will check d-dimer and TSH. 2. Polysubstance abuse including cocaine alcohol and tobacco abuse -patient has been advised to quit these habits and patient being placed on CIWA protocol. 3. Undiagnosed COPD seen in the chest x-ray -presently not wheezing. 4. Suicidal ideation -please consult psychiatry in a.m.  Patient is placed on suicide precautions.   DVT prophylaxis: Lovenox. Code Status: Full code. Family Communication: Discussed with  patient. Disposition Plan: Home. Consults called: None. Admission status: Observation.   Eduard Clos MD [facility] Pager [phone].  If 7PM-7AM, please contact night-coverage [email or web address] Password [facility]  07/16/2017, 9:19 PM

## 2017-07-16 NOTE — ED Triage Notes (Signed)
Pt reports he snorts 1/2 gram cocaine per day and has used everyday this year.  Pt drink beer most days, last drank at 4am, last used cocaine 8 hours ago.  Pt reports he has been having chest pains "for a while", usually every couple days.  Pt is under lot of stress and has thoughts of hurting self.  No plan.

## 2017-07-16 NOTE — ED Provider Notes (Signed)
[facility] EMERGENCY DEPARTMENT Provider Note   CSN: [id number] Arrival date & time: 07/16/17  1305     History   Chief Complaint Chief Complaint  Patient presents with  . Chest Pain  . Suicidal    HPI Jared Robinson Jared Robinson is a 47 y.o. male.  Who presents the emergency department with chief complaint of chest pain and suicidal ideation.  Patient has a history of daily cocaine abuse.  Patient states that normally he gets a little bit of chest pain and tachycardia when he is using.  This morning after night of use he woke up with chest pain, intermittent sharp saw left-sided chest pain with associated shortness of breath.  Patient has also been feeling suicidal for the past 2 days.  He has no previous history of suicidal ideation.  Patient states that if he left the hospital today he would either "kill myself or somebody else."  He does have access to weapons at this time.  Patient is here voluntarily.  The patient is a heavy smoker and smokes greater than a pack a day usually 2.  HPI  Past Medical History:  Diagnosis Date  . COPD (chronic obstructive pulmonary disease) (HCC)   . IBS (irritable bowel syndrome)     Patient Active Problem List   Diagnosis Date Noted  . Chest pain 07/16/2017  . Polysubstance abuse (HCC) 07/16/2017  . Suicidal behavior 07/16/2017    Past Surgical History:  Procedure Laterality Date  . HAND SURGERY    . HERNIA REPAIR    . LUMBAR LAMINECTOMY         Home Medications    Prior to Admission medications   Not on File    Family History Family History  Problem Relation Age of Onset  . Hypertension Other     Social History Social History   Tobacco Use  . Smoking status: Current Every Day Smoker    Packs/day: 1.00    Types: Cigarettes  . Smokeless tobacco: Never Used  Substance Use Topics  . Alcohol use: Yes    Comment: 12 beers or more day   . Drug use: Yes    Types: Cocaine, Marijuana    Comment: past history      Allergies   Aspirin   Review of Systems Review of Systems  Ten systems reviewed and are negative for acute change, except as noted in the HPI.   Physical Exam Updated Vital Signs BP 115/82   Pulse 70   Temp 97.9 F (36.6 C) (Oral)   Resp 19   SpO2 97%   Physical Exam  Constitutional: He appears well-developed and well-nourished. No distress.  HENT:  Head: Normocephalic and atraumatic.  Eyes: Conjunctivae are normal. No scleral icterus.  Neck: Normal range of motion. Neck supple.  Cardiovascular: Normal rate, regular rhythm and normal heart sounds.  Pulmonary/Chest: Effort normal and breath sounds normal. No respiratory distress.  Abdominal: Soft. There is no tenderness.  Musculoskeletal: He exhibits no edema.  Neurological: He is alert.  Skin: Skin is warm and dry. He is not diaphoretic.  Psychiatric: His behavior is normal.  Nursing note and vitals reviewed.    ED Treatments / Results  Labs (all labs ordered are listed, but only abnormal results are displayed) Labs Reviewed  CBC - Abnormal; Notable for the following components:      Result Value   Hemoglobin 17.4 (*)    All other components within normal limits  COMPREHENSIVE METABOLIC PANEL - Abnormal; Notable  for the following components:   Chloride 100 (*)    ALT 16 (*)    All other components within normal limits  ACETAMINOPHEN LEVEL - Abnormal; Notable for the following components:   Acetaminophen (Tylenol), Serum <10 (*)    All other components within normal limits  RAPID URINE DRUG SCREEN, HOSP PERFORMED - Abnormal; Notable for the following components:   Cocaine POSITIVE (*)    Tetrahydrocannabinol POSITIVE (*)    All other components within normal limits  HEPATIC FUNCTION PANEL - Abnormal; Notable for the following components:   ALT 14 (*)    All other components within normal limits  ETHANOL  SALICYLATE LEVEL  CBC  CREATININE, SERUM  MAGNESIUM  TSH  TROPONIN I  HIV ANTIBODY (ROUTINE  TESTING)  BASIC METABOLIC PANEL  CBC  TROPONIN I  TROPONIN I  I-STAT TROPONIN, ED  I-STAT TROPONIN, ED    EKG  EKG Interpretation  Date/Time:  Tuesday July 16 2017 19:57:22 EST Ventricular Rate:  80 PR Interval:    QRS Duration: 92 QT Interval:  369 QTC Calculation: 426 R Axis:   84 Text Interpretation:  Sinus rhythm Borderline short PR interval Baseline wander in lead(s) V3 Since last tracing rate slower Confirmed by Eber Hong ([id number]) on 07/16/2017 8:40:55 PM       Radiology Dg Chest 2 View  Result Date: 07/16/2017 CLINICAL DATA:  Chest pain for the past 2 days. Intermittent shortness of breath. EXAM: CHEST  2 VIEW COMPARISON:  Chest x-ray dated October 11, 2015. FINDINGS: The cardiomediastinal silhouette is normal in size. Normal pulmonary vascularity. The lungs remain hyperinflated. No focal consolidation, pleural effusion, or pneumothorax. No acute osseous abnormality. IMPRESSION: COPD.  No active cardiopulmonary disease. Electronically Signed   By: Obie Dredge M.D.   On: 07/16/2017 14:05    Procedures Procedures (including critical care time)  Medications Ordered in ED Medications  nitroGLYCERIN (NITROSTAT) SL tablet 0.4 mg (not administered)  acetaminophen (TYLENOL) tablet 650 mg (not administered)    Or  acetaminophen (TYLENOL) suppository 650 mg (not administered)  ondansetron (ZOFRAN) tablet 4 mg (not administered)    Or  ondansetron (ZOFRAN) injection 4 mg (not administered)  enoxaparin (LOVENOX) injection 40 mg (not administered)  nicotine (NICODERM CQ - dosed in mg/24 hours) patch 21 mg (21 mg Transdermal Patch Applied 07/16/17 2227)  LORazepam (ATIVAN) injection 0-4 mg (1 mg Intravenous Given 07/16/17 2228)    Or  LORazepam (ATIVAN) tablet 0-4 mg ( Oral See Alternative 07/16/17 2228)  LORazepam (ATIVAN) injection 0-4 mg (not administered)    Or  LORazepam (ATIVAN) tablet 0-4 mg (not administered)  thiamine (VITAMIN B-1) tablet 100 mg (not administered)      Or  thiamine (B-1) injection 100 mg (not administered)  gi cocktail (Maalox,Lidocaine,Donnatal) (30 mLs Oral Given 07/16/17 2055)     Initial Impression / Assessment and Plan / ED Course  I have reviewed the triage vital signs and the nursing notes.  Pertinent labs & imaging results that were available during my care of the patient were reviewed by me and considered in my medical decision making (see chart for details).  Clinical Course as of Jul 16 2316  Tue Jul 16, 2017  1943 During my assessment the patient became agitated, squirming on the bed.  He became very diaphoretic and started feeling short of breath.  We are currently repeating an EKG and a repeat troponin at this time.  [AH]    Clinical Course User Index [AH] Arthor Captain, PA-C  Patient repeat EKG and troponin negative for acute abnormality however in the setting of cocaine abuse patient should be admitted for serial troponins.  I have spoken with Dr. Toniann Fail who will take the patient for admission. Final Clinical Impressions(s) / ED Diagnoses   Final diagnoses:  Chest pain, unspecified type  Cocaine abuse [facility])  Suicidal ideation    ED Discharge Orders    None       Arthor Captain, PA-C 07/16/17 2318    Eber Hong, MD 07/22/17 2125

## 2017-07-17 ENCOUNTER — Other Ambulatory Visit: Payer: Self-pay

## 2017-07-17 ENCOUNTER — Encounter (HOSPITAL_COMMUNITY): Payer: Self-pay | Admitting: General Practice

## 2017-07-17 ENCOUNTER — Observation Stay (HOSPITAL_BASED_OUTPATIENT_CLINIC_OR_DEPARTMENT_OTHER): Payer: BLUE CROSS/BLUE SHIELD

## 2017-07-17 DIAGNOSIS — J431 Panlobular emphysema: Secondary | ICD-10-CM | POA: Diagnosis not present

## 2017-07-17 DIAGNOSIS — F17210 Nicotine dependence, cigarettes, uncomplicated: Secondary | ICD-10-CM

## 2017-07-17 DIAGNOSIS — F1910 Other psychoactive substance abuse, uncomplicated: Secondary | ICD-10-CM

## 2017-07-17 DIAGNOSIS — F1410 Cocaine abuse, uncomplicated: Secondary | ICD-10-CM

## 2017-07-17 DIAGNOSIS — I361 Nonrheumatic tricuspid (valve) insufficiency: Secondary | ICD-10-CM | POA: Diagnosis not present

## 2017-07-17 DIAGNOSIS — R45850 Homicidal ideations: Secondary | ICD-10-CM | POA: Diagnosis not present

## 2017-07-17 DIAGNOSIS — R4689 Other symptoms and signs involving appearance and behavior: Secondary | ICD-10-CM | POA: Diagnosis not present

## 2017-07-17 DIAGNOSIS — F1210 Cannabis abuse, uncomplicated: Secondary | ICD-10-CM | POA: Diagnosis not present

## 2017-07-17 DIAGNOSIS — R079 Chest pain, unspecified: Secondary | ICD-10-CM | POA: Diagnosis not present

## 2017-07-17 DIAGNOSIS — F4325 Adjustment disorder with mixed disturbance of emotions and conduct: Secondary | ICD-10-CM | POA: Diagnosis present

## 2017-07-17 DIAGNOSIS — I5021 Acute systolic (congestive) heart failure: Secondary | ICD-10-CM

## 2017-07-17 DIAGNOSIS — R4587 Impulsiveness: Secondary | ICD-10-CM | POA: Diagnosis not present

## 2017-07-17 LAB — BASIC METABOLIC PANEL
ANION GAP: 7 (ref 5–15)
BUN: 12 mg/dL (ref 6–20)
CO2: 25 mmol/L (ref 22–32)
Calcium: 8.8 mg/dL — ABNORMAL LOW (ref 8.9–10.3)
Chloride: 102 mmol/L (ref 101–111)
Creatinine, Ser: 1.03 mg/dL (ref 0.61–1.24)
Glucose, Bld: 94 mg/dL (ref 65–99)
Potassium: 3.9 mmol/L (ref 3.5–5.1)
SODIUM: 134 mmol/L — AB (ref 135–145)

## 2017-07-17 LAB — ECHOCARDIOGRAM COMPLETE

## 2017-07-17 LAB — CBC
HCT: 46.3 % (ref 39.0–52.0)
HEMOGLOBIN: 15.4 g/dL (ref 13.0–17.0)
MCH: 30 pg (ref 26.0–34.0)
MCHC: 33.3 g/dL (ref 30.0–36.0)
MCV: 90.1 fL (ref 78.0–100.0)
Platelets: 254 10*3/uL (ref 150–400)
RBC: 5.14 MIL/uL (ref 4.22–5.81)
RDW: 13.6 % (ref 11.5–15.5)
WBC: 6.6 10*3/uL (ref 4.0–10.5)

## 2017-07-17 LAB — TROPONIN I

## 2017-07-17 LAB — D-DIMER, QUANTITATIVE (NOT AT ARMC)

## 2017-07-17 LAB — HIV ANTIBODY (ROUTINE TESTING W REFLEX): HIV SCREEN 4TH GENERATION: NONREACTIVE

## 2017-07-17 MED ORDER — LEVALBUTEROL HCL 1.25 MG/0.5ML IN NEBU: 1.2500 mg | INHALATION_SOLUTION | Freq: Four times a day (QID) | RESPIRATORY_TRACT | Status: DC | PRN

## 2017-07-17 MED ORDER — LORAZEPAM 2 MG/ML IJ SOLN
2.0000 mg | INTRAMUSCULAR | Status: DC | PRN
  Administered 2017-07-18 – 2017-07-19 (×4): 2 mg via INTRAVENOUS
  Filled 2017-07-17 (×4): qty 1

## 2017-07-17 MED ORDER — ENSURE ENLIVE PO LIQD
237.0000 mL | Freq: Two times a day (BID) | ORAL | Status: DC
  Administered 2017-07-18 – 2017-07-19 (×2): 237 mL via ORAL

## 2017-07-17 MED ORDER — FOLIC ACID 1 MG PO TABS
1.0000 mg | ORAL_TABLET | Freq: Every day | ORAL | Status: DC
  Administered 2017-07-18 – 2017-07-19 (×2): 1 mg via ORAL
  Filled 2017-07-17 (×2): qty 1

## 2017-07-17 MED ORDER — HEPARIN SODIUM (PORCINE) 5000 UNIT/ML IJ SOLN
5000.0000 [IU] | Freq: Three times a day (TID) | INTRAMUSCULAR | Status: DC
  Administered 2017-07-18 – 2017-07-19 (×4): 5000 [IU] via SUBCUTANEOUS
  Filled 2017-07-17 (×4): qty 1

## 2017-07-17 NOTE — Consult Note (Signed)
Jared Robinson   Reason for Robinson:  SI Referring Physician:  Dr. Sherral Hammers Patient Identification: REAL CONA MRN:  [id number] Principal Diagnosis: Adjustment disorder with mixed disturbance of emotions and conduct Diagnosis:   Patient Active Problem List   Diagnosis Date Noted  . Chest pain [R07.9] 07/16/2017  . Polysubstance abuse ([place]) [F19.10] 07/16/2017  . Suicidal behavior [R46.89] 07/16/2017    Total Time spent with patient: 1 hour  Subjective:   Jared Robinson is a 47 y.o. male patient admitted with chest pain in the setting of cocaine use.  HPI:   Per chart review, patient has a history of polysubstance abuse including cocaine, alcohol and tobacco. UDS was positive for cocaine and THC on admission. He presented to the ED yesterday due to recurrent chest pain that was more severe than the usual chest pain associated with cocaine use. He last used cocaine at 2 am prior the day prior to admission. He reports SI due to family issues. He reported, "I would kill myself or somebody else" if I left the hospital today.   On interview, Mr. Gales endorses SI without a plan. He reports that his wife found out 2 days ago that he was having multiple affairs. He had an affair in the past that she had not forgave him for. He reports that she is angry and he thinks the marriage is over. He reports using drugs to self-medicate. He uses cocaine, marijuana and alcohol. He drinks up to 6-8 beers daily and sometimes up to 20 beers. He reports blacking out from substance use. He went to rehab 5-6 years ago and maintained sobriety for 2 years. He reports doing well before his wife found out about his extramarital affairs. He reports that he has ruined his children's lives. He denies HI or AVH. He denies neurovegetative symptoms.   Past Psychiatric History: Cocaine abuse, marijuana abuse and alcohol abuse.  Risk to Self: Is patient at risk for suicide?: Yes Risk to Others:   None. Denies HI.  Prior Inpatient Therapy:  He was admitted to the hospital 5-6 years ago for detox then he completed IOP. Prior Outpatient Therapy:  Denies   Past Medical History:  Past Medical History:  Diagnosis Date  . COPD (chronic obstructive pulmonary disease) ([place])   . IBS (irritable bowel syndrome)     Past Surgical History:  Procedure Laterality Date  . HAND SURGERY    . HERNIA REPAIR    . LUMBAR LAMINECTOMY     Family History:  Family History  Problem Relation Age of Onset  . Hypertension Other    Family Psychiatric  History: Denies  Social History:  Social History   Substance and Sexual Activity  Alcohol Use Yes   Comment: 12 beers or more day      Social History   Substance and Sexual Activity  Drug Use Yes  . Types: Cocaine, Marijuana   Comment: past history    Social History   Socioeconomic History  . Marital status: Married    Spouse name: None  . Number of children: None  . Years of education: None  . Highest education level: None  Social Needs  . Financial resource strain: None  . Food insecurity - worry: None  . Food insecurity - inability: None  . Transportation needs - medical: None  . Transportation needs - non-medical: None  Occupational History  . None  Tobacco Use  . Smoking status: Current Every Day Smoker    Packs/day:  1.00    Types: Cigarettes  . Smokeless tobacco: Never Used  Substance and Sexual Activity  . Alcohol use: Yes    Comment: 12 beers or more day   . Drug use: Yes    Types: Cocaine, Marijuana    Comment: past history  . Sexual activity: None  Other Topics Concern  . None  Social History Narrative  . None   Additional Social History: He lives at home with his wife of 1 years and 97 y/o and 69 y/o children. He has a 12 y/o child who is not living at home. He is employed with [employer] He is a Games developer. He reports cocaine, marijuana and alcohol use. He drinks up to 6-8 beers daily and sometimes up  to 20 beers. He reports a history of withdrawals but denies seizures related to alcohol withdrawal. He denies IVDU.     Allergies:   Allergies  Allergen Reactions  . Aspirin Rash    Labs:  Results for orders placed or performed during the hospital encounter of 07/16/17 (from the past 48 hour(s))  Rapid urine drug screen (hospital performed)     Status: Abnormal   Collection Time: 07/16/17  1:08 PM  Result Value Ref Range   Opiates NONE DETECTED NONE DETECTED   Cocaine POSITIVE (A) NONE DETECTED   Benzodiazepines NONE DETECTED NONE DETECTED   Amphetamines NONE DETECTED NONE DETECTED   Tetrahydrocannabinol POSITIVE (A) NONE DETECTED   Barbiturates NONE DETECTED NONE DETECTED    Comment: (NOTE) DRUG SCREEN FOR MEDICAL PURPOSES ONLY.  IF CONFIRMATION IS NEEDED FOR ANY PURPOSE, NOTIFY LAB WITHIN 5 DAYS. LOWEST DETECTABLE LIMITS FOR URINE DRUG SCREEN Drug Class                     Cutoff (ng/mL) Amphetamine and metabolites    1000 Barbiturate and metabolites    200 Benzodiazepine                 782 Tricyclics and metabolites     300 Opiates and metabolites        300 Cocaine and metabolites        300 THC                            50   CBC     Status: Abnormal   Collection Time: 07/16/17  1:24 PM  Result Value Ref Range   WBC 9.6 4.0 - 10.5 K/uL   RBC 5.71 4.22 - 5.81 MIL/uL   Hemoglobin 17.4 (H) 13.0 - 17.0 g/dL   HCT 51.3 39.0 - 52.0 %   MCV 89.8 78.0 - 100.0 fL   MCH 30.5 26.0 - 34.0 pg   MCHC 33.9 30.0 - 36.0 g/dL   RDW 13.7 11.5 - 15.5 %   Platelets 301 150 - 400 K/uL  Comprehensive metabolic panel     Status: Abnormal   Collection Time: 07/16/17  1:30 PM  Result Value Ref Range   Sodium 137 135 - 145 mmol/L   Potassium 4.4 3.5 - 5.1 mmol/L   Chloride 100 (L) 101 - 111 mmol/L   CO2 26 22 - 32 mmol/L   Glucose, Bld 91 65 - 99 mg/dL   BUN 6 6 - 20 mg/dL   Creatinine, Ser 1.03 0.61 - 1.24 mg/dL   Calcium 9.5 8.9 - 10.3 mg/dL   Total Protein 7.8 6.5 - 8.1 g/dL    Albumin 4.7  3.5 - 5.0 g/dL   AST 21 15 - 41 U/L   ALT 16 (L) 17 - 63 U/L   Alkaline Phosphatase 59 38 - 126 U/L   Total Bilirubin 0.9 0.3 - 1.2 mg/dL   GFR calc non Af Amer >60 >60 mL/min   GFR calc Af Amer >60 >60 mL/min    Comment: (NOTE) The eGFR has been calculated using the CKD EPI equation. This calculation has not been validated in all clinical situations. eGFR's persistently <60 mL/min signify possible Chronic Kidney Disease.    Anion gap 11 5 - 15  I-stat troponin, ED     Status: None   Collection Time: 07/16/17  1:40 PM  Result Value Ref Range   Troponin i, poc 0.01 0.00 - 0.08 ng/mL   Comment 3            Comment: Due to the release kinetics of cTnI, a negative result within the first hours of the onset of symptoms does not rule out myocardial infarction with certainty. If myocardial infarction is still suspected, repeat the test at appropriate intervals.   Ethanol     Status: None   Collection Time: 07/16/17  3:35 PM  Result Value Ref Range   Alcohol, Ethyl (B) <10 <10 mg/dL    Comment:        LOWEST DETECTABLE LIMIT FOR SERUM ALCOHOL IS 10 mg/dL FOR MEDICAL PURPOSES ONLY   Salicylate level     Status: None   Collection Time: 07/16/17  3:35 PM  Result Value Ref Range   Salicylate Lvl <3.7 2.8 - 30.0 mg/dL  Acetaminophen level     Status: Abnormal   Collection Time: 07/16/17  3:35 PM  Result Value Ref Range   Acetaminophen (Tylenol), Serum <10 (L) 10 - 30 ug/mL    Comment:        THERAPEUTIC CONCENTRATIONS VARY SIGNIFICANTLY. A RANGE OF 10-30 ug/mL MAY BE AN EFFECTIVE CONCENTRATION FOR MANY PATIENTS. HOWEVER, SOME ARE BEST TREATED AT CONCENTRATIONS OUTSIDE THIS RANGE. ACETAMINOPHEN CONCENTRATIONS >150 ug/mL AT 4 HOURS AFTER INGESTION AND >50 ug/mL AT 12 HOURS AFTER INGESTION ARE OFTEN ASSOCIATED WITH TOXIC REACTIONS.   I-stat troponin, ED     Status: None   Collection Time: 07/16/17  7:40 PM  Result Value Ref Range   Troponin i, poc 0.00 0.00  - 0.08 ng/mL   Comment 3            Comment: Due to the release kinetics of cTnI, a negative result within the first hours of the onset of symptoms does not rule out myocardial infarction with certainty. If myocardial infarction is still suspected, repeat the test at appropriate intervals.   CBC     Status: None   Collection Time: 07/16/17  9:20 PM  Result Value Ref Range   WBC 7.5 4.0 - 10.5 K/uL   RBC 5.13 4.22 - 5.81 MIL/uL   Hemoglobin 15.9 13.0 - 17.0 g/dL   HCT 45.6 39.0 - 52.0 %   MCV 88.9 78.0 - 100.0 fL   MCH 31.0 26.0 - 34.0 pg   MCHC 34.9 30.0 - 36.0 g/dL   RDW 13.7 11.5 - 15.5 %   Platelets 250 150 - 400 K/uL  Creatinine, serum     Status: None   Collection Time: 07/16/17  9:20 PM  Result Value Ref Range   Creatinine, Ser 1.05 0.61 - 1.24 mg/dL   GFR calc non Af Amer >60 >60 mL/min   GFR calc Af Amer >  60 >60 mL/min    Comment: (NOTE) The eGFR has been calculated using the CKD EPI equation. This calculation has not been validated in all clinical situations. eGFR's persistently <60 mL/min signify possible Chronic Kidney Disease.   Magnesium     Status: None   Collection Time: 07/16/17  9:20 PM  Result Value Ref Range   Magnesium 2.1 1.7 - 2.4 mg/dL  TSH     Status: None   Collection Time: 07/16/17  9:20 PM  Result Value Ref Range   TSH 3.954 0.350 - 4.500 uIU/mL    Comment: Performed by a 3rd Generation assay with a functional sensitivity of <=0.01 uIU/mL.  Troponin I     Status: None   Collection Time: 07/16/17  9:20 PM  Result Value Ref Range   Troponin I <0.03 <0.03 ng/mL  Hepatic function panel     Status: Abnormal   Collection Time: 07/16/17  9:20 PM  Result Value Ref Range   Total Protein 6.7 6.5 - 8.1 g/dL   Albumin 3.9 3.5 - 5.0 g/dL   AST 20 15 - 41 U/L   ALT 14 (L) 17 - 63 U/L   Alkaline Phosphatase 52 38 - 126 U/L   Total Bilirubin 1.0 0.3 - 1.2 mg/dL   Bilirubin, Direct 0.1 0.1 - 0.5 mg/dL   Indirect Bilirubin 0.9 0.3 - 0.9 mg/dL  Basic  metabolic panel     Status: Abnormal   Collection Time: 07/17/17  3:38 AM  Result Value Ref Range   Sodium 134 (L) 135 - 145 mmol/L   Potassium 3.9 3.5 - 5.1 mmol/L   Chloride 102 101 - 111 mmol/L   CO2 25 22 - 32 mmol/L   Glucose, Bld 94 65 - 99 mg/dL   BUN 12 6 - 20 mg/dL   Creatinine, Ser 1.03 0.61 - 1.24 mg/dL   Calcium 8.8 (L) 8.9 - 10.3 mg/dL   GFR calc non Af Amer >60 >60 mL/min   GFR calc Af Amer >60 >60 mL/min    Comment: (NOTE) The eGFR has been calculated using the CKD EPI equation. This calculation has not been validated in all clinical situations. eGFR's persistently <60 mL/min signify possible Chronic Kidney Disease.    Anion gap 7 5 - 15  CBC     Status: None   Collection Time: 07/17/17  3:38 AM  Result Value Ref Range   WBC 6.6 4.0 - 10.5 K/uL   RBC 5.14 4.22 - 5.81 MIL/uL   Hemoglobin 15.4 13.0 - 17.0 g/dL   HCT 46.3 39.0 - 52.0 %   MCV 90.1 78.0 - 100.0 fL   MCH 30.0 26.0 - 34.0 pg   MCHC 33.3 30.0 - 36.0 g/dL   RDW 13.6 11.5 - 15.5 %   Platelets 254 150 - 400 K/uL  Troponin I     Status: None   Collection Time: 07/17/17  3:38 AM  Result Value Ref Range   Troponin I <0.03 <0.03 ng/mL  Troponin I     Status: None   Collection Time: 07/17/17 10:30 AM  Result Value Ref Range   Troponin I <0.03 <0.03 ng/mL  D-dimer, quantitative (not at [facility])     Status: None   Collection Time: 07/17/17 10:30 AM  Result Value Ref Range   D-Dimer, Quant <0.27 0.00 - 0.50 ug/mL-FEU    Comment: (NOTE) At the manufacturer cut-off of 0.50 ug/mL FEU, this assay has been documented to exclude PE with a sensitivity and negative predictive value  of 97 to 99%.  At this time, this assay has not been approved by the FDA to exclude DVT/VTE. Results should be correlated with clinical presentation.     Current Facility-Administered Medications  Medication Dose Route Frequency Provider Last Rate Last Dose  . acetaminophen (TYLENOL) tablet 650 mg  650 mg Oral Q6H PRN Rise Patience, MD       Or  . acetaminophen (TYLENOL) suppository 650 mg  650 mg Rectal Q6H PRN Rise Patience, MD      . enoxaparin (LOVENOX) injection 40 mg  40 mg Subcutaneous Q24H Rise Patience, MD      . LORazepam (ATIVAN) injection 0-4 mg  0-4 mg Intravenous Q6H Harris, Abigail, PA-C   1 mg at 07/16/17 2228   Or  . LORazepam (ATIVAN) tablet 0-4 mg  0-4 mg Oral Q6H Harris, Abigail, PA-C      . [START ON 07/19/2017] LORazepam (ATIVAN) injection 0-4 mg  0-4 mg Intravenous Q12H Harris, Abigail, PA-C       Or  . Derrill Memo ON 07/19/2017] LORazepam (ATIVAN) tablet 0-4 mg  0-4 mg Oral Q12H Harris, Abigail, PA-C      . nicotine (NICODERM CQ - dosed in mg/24 hours) patch 21 mg  21 mg Transdermal Daily Harris, Abigail, PA-C   21 mg at 07/17/17 0954  . nitroGLYCERIN (NITROSTAT) SL tablet 0.4 mg  0.4 mg Sublingual Q5 min PRN Harris, Abigail, PA-C      . ondansetron (ZOFRAN) tablet 4 mg  4 mg Oral Q6H PRN Rise Patience, MD       Or  . ondansetron [facility]) injection 4 mg  4 mg Intravenous Q6H PRN Rise Patience, MD      . thiamine (VITAMIN B-1) tablet 100 mg  100 mg Oral Daily Harris, Abigail, PA-C   100 mg at 07/17/17 9381   Or  . thiamine (B-1) injection 100 mg  100 mg Intravenous Daily Margarita Mail, PA-C       No current outpatient medications on file.    Musculoskeletal: Strength & Muscle Tone: within normal limits Gait & Station: UTA since patient was lying in bed. Patient leans: N/A  Psychiatric Specialty Exam: Physical Exam  Nursing note and vitals reviewed. Constitutional: He is oriented to person, place, and time. He appears well-developed and well-nourished.  HENT:  Head: Normocephalic and atraumatic.  Neck: Normal range of motion.  Respiratory: Effort normal.  Musculoskeletal: Normal range of motion.  Neurological: He is alert and oriented to person, place, and time.  Skin: No rash noted.  Psychiatric: His speech is normal and behavior is normal. Thought  content normal. Cognition and memory are normal. He expresses impulsivity. He exhibits a depressed mood.    Review of Systems  Constitutional: Positive for chills and fever.  Cardiovascular: Positive for chest pain.  Gastrointestinal: Negative for abdominal pain, constipation, diarrhea, nausea and vomiting.  Neurological: Positive for headaches.    Blood pressure (!) 128/98, pulse 68, temperature 97.9 F (36.6 C), temperature source Oral, resp. rate 15, SpO2 97 %.There is no height or weight on file to calculate BMI.  General Appearance: Fairly Groomed, middle aged, Caucasian male with an unshaved face and wearing paper hospital scrubs who is lying in bed. NAD.   Eye Contact:  Good  Speech:  Clear and Coherent and Normal Rate  Volume:  Normal  Mood:  Depressed  Affect:  Depressed and Tearful  Thought Process:  Goal Directed and Linear  Orientation:  Full (Time,  Place, and Person)  Thought Content:  Logical  Suicidal Thoughts:  Yes.  without intent/plan  Homicidal Thoughts:  No  Memory:  Immediate;   Good Recent;   Good Remote;   Good  Judgement:  Fair  Insight:  Fair  Psychomotor Activity:  Normal  Concentration:  Concentration: Good and Attention Span: Good  Recall:  Good  Fund of Knowledge:  Good  Language:  Good  Akathisia:  No  Handed:  Right  AIMS (if indicated):   N/A  Assets:  Communication Skills Desire for Improvement Financial Resources/Insurance Housing Social Support  ADL's:  Intact  Cognition:  WNL  Sleep:   Okay   Assessment:  Jared Robinson is a 47 y.o. male who was admitted with chest pain in the setting of cocaine use. He endorsed SI due to marital discord secondary to having an affair. He also reports substance use. He warrants inpatient psychiatric hospitalization for stabilization and treatment.   Treatment Plan Summary: -Patient warrants inpatient psychiatric hospitalization given high risk of harm to self. -Continue bedside sitter.  -Recommend  starting Gabapentin 300 mg TID for cocaine withdrawal and alcoholism.  -Patient would like resources for marital counseling which can be provided during inpatient psychiatric hospitalization.  -Please pursue involuntary commitment if patient refuses voluntary psychiatric hospitalization or attempts to leave the hospital.  -Will sign off on patient at this time. Please Robinson psychiatry again as needed.     Disposition: Recommend psychiatric Inpatient admission when medically cleared.  Faythe Dingwall, DO 07/17/2017 12:19 PM

## 2017-07-17 NOTE — Progress Notes (Signed)
PROGRESS NOTE    ROI HJORT  LKT:[id number] DOB: 12/14/1970 DOA: 07/16/2017 PCP: Burton Apley, MD   Brief Narrative:  47 y.o. WM PMHx Polysubstance (Cocaine, EtOH,Tobacco), COPD, IBS  Presents to the ER after patient started having more recurrent chest pain than usual.  Patient states that over the last 2 months patient has been having chest pain off and on but since this morning it became more frequent than usual.  Has palpitations along with it.  Pain is retrosternal stabbing in nature last for a few minutes this time no associated symptoms with exertion or any shortness of breath fever chills or productive cough.  Patient admits to have taken cocaine at 2 AM chest pain started around 4 AM.  In addition patient admits to be having suicidal ideation due to family issues.  Patient denies having taken any other medications.   ED Course: In the ER patient was asked with chest pain so nitroglycerin has been ordered.  EKG was showing normal sinus rhythm with nonspecific changes with chest x-ray showing COPD findings.  Patient is placed on suicide precaution and admitted for further workup of chest pain.    Subjective: 1/2 S/O x4, negative CP, negative S OB, negative abdominal pain, negative N/V.  States last used cocaine on 12/31.  Other than cocaine, marijuana, EtOH does not use any other drugs.    Assessment & Plan:   Principal Problem:   Chest pain Active Problems:   Polysubstance abuse (HCC)   Suicidal behavior  Polysubstance abuse - 1/1 UDS positive cocaine and marijuana -Negative EtOH -Spoke at length with patient patient concerning the absolute need to abstain from cocaine, marijuana, tobacco, EtOH use.  Of continuing to use these substances increased risk for MI, stroke, DEATH -CIWA, Protocol   COPD -Titrate O2 to maintain SPO2 89 and 90% - Xopenex PRN  Acute systolic CHF -Most likely secondary to patient's cocaine/EtOH use -On 1/3 we will contact CHF team per  patient has never been seen by cardiologist in the past. -Strict in and outs -Daily weight -NTG PRN -TSH WNL, d-dimer WNL -Patient's last use of cocaine 12/31 avoid unopposed beta-blocker -Basically patient's BP control  Chest pain - Troponin x3 negative  Suicidal/homicidal ideation -1/2 suicidal ideation per admission note per [facility] psychiatry consult placed -Per psychiatry Patient medically stable will be placed in inpatient facility -Suicide precaution -Suicide sitter      DVT prophylaxis: Heparin subcu per pharmacy Code Status:  Family Communication: None Disposition Plan: Inpatient psych facility   Consultants:  Psychiatry    Procedures/Significant Events:  1/2 Echocardiogram: LVEF =l40% to 45%. Diffuse hypokinesis.      I have personally reviewed and interpreted all radiology studies and my findings are as above.  VENTILATOR SETTINGS: None   Cultures   Antimicrobials:    Devices \  LINES / TUBES:      Continuous Infusions:   Objective: Vitals:   07/17/17 0528 07/17/17 0600 07/17/17 0700 07/17/17 0800  BP: (!) 122/92 101/79 (!) 137/94 (!) 118/98  Pulse: 71 64 78 68  Resp:  16 20 16   Temp:      TempSrc:      SpO2:  96% 99% 98%   No intake or output data in the 24 hours ending 07/17/17 0903 There were no vitals filed for this visit.  Examination:  General: A/O x4 No acute respiratory distress Neck:  Negative scars, masses, torticollis, lymphadenopathy, JVD Lungs: Clear to auscultation bilaterally without wheezes or crackles Cardiovascular: Regular rate  and rhythm without murmur gallop or rub normal S1 and S2 Abdomen: negative abdominal pain, nondistended, positive soft, bowel sounds, no rebound, no ascites, no appreciable mass Extremities: No significant cyanosis, clubbing, or edema bilateral lower extremities Skin: Negative rashes, lesions, ulcers Psychiatric:  Negative depression, negative anxiety, negative fatigue, negative  mania  Central nervous system:  Cranial nerves II through XII intact, tongue/uvula midline, all extremities muscle strength 5/5, sensation intact throughout,  negative dysarthria, negative expressive aphasia, negative receptive aphasia.  .     Data Reviewed: Care during the described time interval was provided by me .  I have reviewed this patient's available data, including medical history, events of note, physical examination, and all test results as part of my evaluation.   CBC: Recent Labs  Lab 07/16/17 1324 07/16/17 2120 07/17/17 0338  WBC 9.6 7.5 6.6  HGB 17.4* 15.9 15.4  HCT 51.3 45.6 46.3  MCV 89.8 88.9 90.1  PLT 301 250 254   Basic Metabolic Panel: Recent Labs  Lab 07/16/17 1330 07/16/17 2120 07/17/17 0338  NA 137  --  134*  K 4.4  --  3.9  CL 100*  --  102  CO2 26  --  25  GLUCOSE 91  --  94  BUN 6  --  12  CREATININE 1.03 1.05 1.03  CALCIUM 9.5  --  8.8*  MG  --  2.1  --    GFR: CrCl cannot be calculated (Unknown ideal weight.). Liver Function Tests: Recent Labs  Lab 07/16/17 1330 07/16/17 2120  AST 21 20  ALT 16* 14*  ALKPHOS 59 52  BILITOT 0.9 1.0  PROT 7.8 6.7  ALBUMIN 4.7 3.9   No results for input(s): LIPASE, AMYLASE in the last 168 hours. No results for input(s): AMMONIA in the last 168 hours. Coagulation Profile: No results for input(s): INR, PROTIME in the last 168 hours. Cardiac Enzymes: Recent Labs  Lab 07/16/17 2120 07/17/17 0338  TROPONINI <0.03 <0.03   BNP (last 3 results) No results for input(s): PROBNP in the last 8760 hours. HbA1C: No results for input(s): HGBA1C in the last 72 hours. CBG: No results for input(s): GLUCAP in the last 168 hours. Lipid Profile: No results for input(s): CHOL, HDL, LDLCALC, TRIG, CHOLHDL, LDLDIRECT in the last 72 hours. Thyroid Function Tests: Recent Labs    07/16/17 2120  TSH 3.954   Anemia Panel: No results for input(s): VITAMINB12, FOLATE, FERRITIN, TIBC, IRON, RETICCTPCT in the  last 72 hours. Urine analysis: No results found for: COLORURINE, APPEARANCEUR, LABSPEC, PHURINE, GLUCOSEU, HGBUR, BILIRUBINUR, KETONESUR, PROTEINUR, UROBILINOGEN, NITRITE, LEUKOCYTESUR Sepsis Labs: @LABRCNTIP (procalcitonin:4,lacticidven:4)  )No results found for this or any previous visit (from the past 240 hour(s)).       Radiology Studies: Dg Chest 2 View  Result Date: 07/16/2017 CLINICAL DATA:  Chest pain for the past 2 days. Intermittent shortness of breath. EXAM: CHEST  2 VIEW COMPARISON:  Chest x-ray dated October 11, 2015. FINDINGS: The cardiomediastinal silhouette is normal in size. Normal pulmonary vascularity. The lungs remain hyperinflated. No focal consolidation, pleural effusion, or pneumothorax. No acute osseous abnormality. IMPRESSION: COPD.  No active cardiopulmonary disease. Electronically Signed   By: Obie Dredge M.D.   On: 07/16/2017 14:05        Scheduled Meds: . enoxaparin (LOVENOX) injection  40 mg Subcutaneous Q24H  . LORazepam  0-4 mg Intravenous Q6H   Or  . LORazepam  0-4 mg Oral Q6H  . [START ON 07/19/2017] LORazepam  0-4 mg Intravenous Q12H  Or  . [START ON 07/19/2017] LORazepam  0-4 mg Oral Q12H  . nicotine  21 mg Transdermal Daily  . thiamine  100 mg Oral Daily   Or  . thiamine  100 mg Intravenous Daily   Continuous Infusions:   LOS: 0 days    Time spent: 40 minutes    Dnyla Antonetti, Roselind Messier, MD [facility] Pager [phone]   If 7PM-7AM, please contact night-coverage [email or web address] Password [email or web address] 07/17/2017, 9:03 AM

## 2017-07-17 NOTE — ED Notes (Signed)
Pt to echo.

## 2017-07-17 NOTE — ED Notes (Signed)
Heart healthy lunch tray ordered w/ no sharps requested @1118 

## 2017-07-17 NOTE — ED Notes (Signed)
Pt wife called, pt is asleep at this time. I have discussed with her that I cannot give information over the phone about the patient due to HIPAA. I told her that I would have him call her when he wakes up. She continued to ask me questions and I kept telling her I cannot give her information over the phone. She hung up on me. The patient is still asleep and I will let him know when he wakes up that she has called for him to call and give her information if he would like.

## 2017-07-17 NOTE — ED Notes (Signed)
Patient requested to have info released to his wife. Patient signed the information release sheet. Wife's name is Niel Kaz.

## 2017-07-17 NOTE — Progress Notes (Signed)
Echocardiogram 2D Echocardiogram has been performed.  Jared Robinson 07/17/2017, 8:55 AM

## 2017-07-18 DIAGNOSIS — R4689 Other symptoms and signs involving appearance and behavior: Secondary | ICD-10-CM

## 2017-07-18 DIAGNOSIS — R45851 Suicidal ideations: Secondary | ICD-10-CM | POA: Diagnosis not present

## 2017-07-18 DIAGNOSIS — F1410 Cocaine abuse, uncomplicated: Secondary | ICD-10-CM | POA: Diagnosis not present

## 2017-07-18 DIAGNOSIS — F1910 Other psychoactive substance abuse, uncomplicated: Secondary | ICD-10-CM | POA: Diagnosis not present

## 2017-07-18 DIAGNOSIS — J431 Panlobular emphysema: Secondary | ICD-10-CM

## 2017-07-18 LAB — COMPREHENSIVE METABOLIC PANEL
ALBUMIN: 4 g/dL (ref 3.5–5.0)
ALK PHOS: 51 U/L (ref 38–126)
ALT: 13 U/L — ABNORMAL LOW (ref 17–63)
ANION GAP: 8 (ref 5–15)
AST: 16 U/L (ref 15–41)
BILIRUBIN TOTAL: 1.1 mg/dL (ref 0.3–1.2)
BUN: 10 mg/dL (ref 6–20)
CALCIUM: 9.2 mg/dL (ref 8.9–10.3)
CO2: 26 mmol/L (ref 22–32)
Chloride: 102 mmol/L (ref 101–111)
Creatinine, Ser: 1.07 mg/dL (ref 0.61–1.24)
GFR calc non Af Amer: >60 mL/min (ref >60)
GLUCOSE: 101 mg/dL — AB (ref 65–99)
Potassium: 4.2 mmol/L (ref 3.5–5.1)
Sodium: 136 mmol/L (ref 135–145)
TOTAL PROTEIN: 7 g/dL (ref 6.5–8.1)

## 2017-07-18 LAB — CBC
HCT: 50.5 % (ref 39.0–52.0)
Hemoglobin: 17 g/dL (ref 13.0–17.0)
MCH: 30.4 pg (ref 26.0–34.0)
MCHC: 33.7 g/dL (ref 30.0–36.0)
MCV: 90.3 fL (ref 78.0–100.0)
PLATELETS: 263 10*3/uL (ref 150–400)
RBC: 5.59 MIL/uL (ref 4.22–5.81)
RDW: 13.4 % (ref 11.5–15.5)
WBC: 7.1 10*3/uL (ref 4.0–10.5)

## 2017-07-18 LAB — PHOSPHORUS: Phosphorus: 3.6 mg/dL (ref 2.5–4.6)

## 2017-07-18 LAB — LIPID PANEL
CHOL/HDL RATIO: 4.3 ratio
CHOLESTEROL: 178 mg/dL (ref 0–200)
HDL: 41 mg/dL (ref >40)
LDL Cholesterol: 105 mg/dL — ABNORMAL HIGH (ref 0–99)
Triglycerides: 160 mg/dL — ABNORMAL HIGH (ref <150)
VLDL: 32 mg/dL (ref 0–40)

## 2017-07-18 LAB — HEMOGLOBIN A1C
Hgb A1c MFr Bld: 5 % (ref 4.8–5.6)
MEAN PLASMA GLUCOSE: 96.8 mg/dL

## 2017-07-18 LAB — MAGNESIUM: Magnesium: 2.2 mg/dL (ref 1.7–2.4)

## 2017-07-18 MED ORDER — GABAPENTIN 600 MG PO TABS
300.0000 mg | ORAL_TABLET | Freq: Three times a day (TID) | ORAL | Status: DC
  Administered 2017-07-19 (×2): 300 mg via ORAL
  Filled 2017-07-18 (×2): qty 1

## 2017-07-18 NOTE — Progress Notes (Signed)
Initial Nutrition Assessment  DOCUMENTATION CODES:   Not applicable  INTERVENTION:   Continue Ensure Enlive po BID, each supplement provides 350 kcal and 20 grams of protein  NUTRITION DIAGNOSIS:   Increased nutrient needs related to other (see comment)(polysubstance abuse) as evidenced by estimated needs.  GOAL:   Patient will meet greater than or equal to 90% of their needs  MONITOR:   PO intake, Supplement acceptance, Weight trends, I & O's  REASON FOR ASSESSMENT:   Malnutrition Screening Tool    ASSESSMENT:   Pt with PMH of IBS, polysubstance abuse, and anxiety presents with suicidal indentation and chest pain in the setting of cocaine use. Pt with undiagnosed COPD detected in chest x-ray.   Psych following. CSW searching for inpatient psychiatric facility.  Per chart, pt drinks up to 6-8 beers daily and sometimes more. Pt on CIWA protocol. Per meal completion records, pt consuming 10% of meals at this time. Per nutrition screening, pt reports 15 lb weight loss in 8 months r/t "pain from hemorrhoids." Limited weight history available per chart, last weight from 02/06/14 pt weighed 160 lbs. Pt's weight seems to remain fairly stable.  Ensure ordered for pt BID to start at 1000 this morning, each supplement provides 350 kcal and 20 grams of protein.  Labs reviewed; cocaine and THC positive Medications reviewed; folic acid, Ativan, thiamine  NUTRITION - FOCUSED PHYSICAL EXAM:  Unable to assess at this time   Diet Order:  Diet Heart Room service appropriate? Yes; Fluid consistency: Thin  EDUCATION NEEDS:   Not appropriate for education at this time  Skin:  Skin Assessment: Reviewed RN Assessment  Last BM:  07/17/17  Height:   Ht Readings from Last 1 Encounters:  07/17/17 5\' 10"  (1.778 m)    Weight:   Wt Readings from Last 1 Encounters:  07/17/17 163 lb 2.3 oz (74 kg)    Ideal Body Weight:  75.45 kg  BMI:  Body mass index is 23.41 kg/m.  Estimated  Nutritional Needs:   Kcal:  1850-2050  Protein:  85-95 grams  Fluid:  >/= 1.8 L/d  Fransisca Kaufmann, MS, RDN, LDN 07/18/2017 10:09 AM

## 2017-07-18 NOTE — Progress Notes (Signed)
Patient very upset over his new heart diagnosis (1/3 very damaged) and choices he has made that are hurting him and his family. (drug use and relational problems) Likely going to some type of drug rehab?  Family has lots of issues-anger, hurts, etc.Encouraged patient to choose to make healthy changes that will help him deal with the heart and other issues.  Will need supportive people he can talk to as mentors.  Requested a Bible and another visit from chaplain in the morning. Phebe Colla, Chaplain   07/18/17 2000  Clinical Encounter Type  Visited With Patient;Other (Comment) Psychiatrist)  Visit Type Initial;Spiritual support;Behavioral Health  Referral From Nurse  Consult/Referral To Chaplain  Spiritual Encounters  Spiritual Needs Literature;Prayer;Emotional;Other (Comment) (dealing with guilt and regrets, fears)  Stress Factors  Patient Stress Factors Major life changes  Family Stress Factors Family relationships;Major life changes

## 2017-07-18 NOTE — Progress Notes (Signed)
PROGRESS NOTE    Jared Robinson  AJO:[id number] DOB: 02-27-1971 DOA: 07/16/2017 PCP: Burton Apley, MD   Brief Narrative:  47 y.o. WM PMHx Polysubstance (Cocaine, EtOH,Tobacco), COPD, IBS  Presents to the ER after patient started having more recurrent chest pain than usual.  Patient states that over the last 2 months patient has been having chest pain off and on but since this morning it became more frequent than usual.  Has palpitations along with it.  Pain is retrosternal stabbing in nature last for a few minutes this time no associated symptoms with exertion or any shortness of breath fever chills or productive cough.  Patient admits to have taken cocaine at 2 AM chest pain started around 4 AM.  In addition patient admits to be having suicidal ideation due to family issues.  Patient denies having taken any other medications.   ED Course: In the ER patient was asked with chest pain so nitroglycerin has been ordered.  EKG was showing normal sinus rhythm with nonspecific changes with chest x-ray showing COPD findings.  Patient is placed on suicide precaution and admitted for further workup of chest pain.    Subjective: 1/3 S/O 4, negative CP, negative SOB, negative abdominal pain, negative N/V positive anxiety, uncomfortable may be starting to withdraw.     Assessment & Plan:   Principal Problem:   Adjustment disorder with mixed disturbance of emotions and conduct Active Problems:   Chest pain   Polysubstance abuse (HCC)   Suicidal behavior  Polysubstance abuse - 1/1 UDS positive cocaine and marijuana -Negative EtOH -Spoke at length with patient patient concerning the absolute need to abstain from cocaine, marijuana, tobacco, EtOH use.  Of continuing to use these substances increased risk for MI, stroke, DEATH -CIWA, Protocol -Gabapentin 300 mg TID per psychiatry recommendation   COPD -Titrate O2 to maintain SPO2 89 and 90% - Xopenex PRN  Acute systolic CHF -Most likely  secondary to patient's cocaine/EtOH use -On 1/3 we will contact CHF team per patient has never been seen by cardiologist in the past. -Strict in and outs since admission +399ml -Daily weight Filed Weights   07/17/17 2006  Weight: 163 lb 2.3 oz (74 kg)  -NTG PRN -TSH/d-dimer WNL -Patient states last use of cocaine 10/31 avoid unopposed beta blocker. -Patient relatively hypotensive therefore will not start any CHF medication at this time. -1/4 reconsult cardiology CHF team patient has never been evaluated by cardiologist. Counseled patient and wife that during acute phase cardiology most likely would not want to perform any sort of stress test or cardiac catheterization. Most likely would be accomplished as an outpatient after he completes rehabilitation  Chest pain Recent Labs  Lab 07/16/17 2120 07/17/17 0338 07/17/17 1030  TROPONINI <0.03 <0.03 <0.03  ]- Troponin x3 negative  Suicidal/homicidal ideation -1/2 suicidal ideation per admission note per [facility] psychiatry consult placed -Per psychiatry Patient medically stable will be placed in inpatient facility -Suicide precaution -Suicide sitter      DVT prophylaxis: Heparin subcu per pharmacy Code Status:  Family Communication: Wife at bedside for discussion of plan of care Disposition Plan: Inpatient psych facility   Consultants:  Psychiatry    Procedures/Significant Events:  1/2 Echocardiogram: LVEF =l40% to 45%. Diffuse hypokinesis.      I have personally reviewed and interpreted all radiology studies and my findings are as above.  VENTILATOR SETTINGS: None   Cultures   Antimicrobials:    Devices   LINES / TUBES:      Continuous  Infusions:   Objective: Vitals:   07/18/17 1249 07/18/17 1252 07/18/17 1641 07/18/17 2044  BP:  (!) 127/92 123/80 109/89  Pulse:  91 88   Resp:  18 15   Temp: 97.9 F (36.6 C)  97.6 F (36.4 C) 98.3 F (36.8 C)  TempSrc: Oral  Oral Oral  SpO2:  98% 100%     Weight:      Height:        Intake/Output Summary (Last 24 hours) at 07/18/2017 2135 Last data filed at 07/18/2017 1706 Gross per 24 hour  Intake 1000 ml  Output 725 ml  Net 275 ml   Filed Weights   07/17/17 2006  Weight: 163 lb 2.3 oz (74 kg)    Physical Exam:  General: A/O 4, No acute respiratory distress, cachectic Neck:  Negative scars, masses, torticollis, lymphadenopathy, JVD Lungs: Clear to auscultation bilaterally without wheezes or crackles Cardiovascular: Regular rate and rhythm without murmur gallop or rub normal S1 and S2 Abdomen: negative abdominal pain, nondistended, positive soft, bowel sounds, no rebound, no ascites, no appreciable mass Extremities: No significant cyanosis, clubbing, or edema bilateral lower extremities Skin: Negative rashes, lesions, ulcers Psychiatric:  Negative depression, positive inpatient anxiety, negative fatigue, negative mania  Central nervous system:  Cranial nerves II through XII intact, tongue/uvula midline, all extremities muscle strength 5/5, sensation intact throughout,  negative dysarthria, negative expressive aphasia, negative receptive aphasia.     Data Reviewed: Care during the described time interval was provided by me .  I have reviewed this patient's available data, including medical history, events of note, physical examination, and all test results as part of my evaluation.   CBC: Recent Labs  Lab 07/16/17 1324 07/16/17 2120 07/17/17 0338 07/18/17 0441  WBC 9.6 7.5 6.6 7.1  HGB 17.4* 15.9 15.4 17.0  HCT 51.3 45.6 46.3 50.5  MCV 89.8 88.9 90.1 90.3  PLT 301 250 254 263   Basic Metabolic Panel: Recent Labs  Lab 07/16/17 1330 07/16/17 2120 07/17/17 0338 07/18/17 0441  NA 137  --  134* 136  K 4.4  --  3.9 4.2  CL 100*  --  102 102  CO2 26  --  25 26  GLUCOSE 91  --  94 101*  BUN 6  --  12 10  CREATININE 1.03 1.05 1.03 1.07  CALCIUM 9.5  --  8.8* 9.2  MG  --  2.1  --  2.2  PHOS  --   --   --  3.6    GFR: Estimated Creatinine Clearance: 91 mL/min (by C-G formula based on SCr of 1.07 mg/dL). Liver Function Tests: Recent Labs  Lab 07/16/17 1330 07/16/17 2120 07/18/17 0441  AST 21 20 16   ALT 16* 14* 13*  ALKPHOS 59 52 51  BILITOT 0.9 1.0 1.1  PROT 7.8 6.7 7.0  ALBUMIN 4.7 3.9 4.0   No results for input(s): LIPASE, AMYLASE in the last 168 hours. No results for input(s): AMMONIA in the last 168 hours. Coagulation Profile: No results for input(s): INR, PROTIME in the last 168 hours. Cardiac Enzymes: Recent Labs  Lab 07/16/17 2120 07/17/17 0338 07/17/17 1030  TROPONINI <0.03 <0.03 <0.03   BNP (last 3 results) No results for input(s): PROBNP in the last 8760 hours. HbA1C: Recent Labs    07/18/17 2041  HGBA1C 5.0   CBG: No results for input(s): GLUCAP in the last 168 hours. Lipid Profile: No results for input(s): CHOL, HDL, LDLCALC, TRIG, CHOLHDL, LDLDIRECT in the last 72 hours.  Thyroid Function Tests: Recent Labs    07/16/17 2120  TSH 3.954   Anemia Panel: No results for input(s): VITAMINB12, FOLATE, FERRITIN, TIBC, IRON, RETICCTPCT in the last 72 hours. Urine analysis: No results found for: COLORURINE, APPEARANCEUR, LABSPEC, PHURINE, GLUCOSEU, HGBUR, BILIRUBINUR, KETONESUR, PROTEINUR, UROBILINOGEN, NITRITE, LEUKOCYTESUR Sepsis Labs: @LABRCNTIP (procalcitonin:4,lacticidven:4)  )No results found for this or any previous visit (from the past 240 hour(s)).       Radiology Studies: No results found.      Scheduled Meds: . feeding supplement (ENSURE ENLIVE)  237 mL Oral BID BM  . folic acid  1 mg Oral Daily  . gabapentin  300 mg Oral TID  . heparin injection (subcutaneous)  5,000 Units Subcutaneous Q8H  . LORazepam  0-4 mg Intravenous Q6H   Or  . LORazepam  0-4 mg Oral Q6H  . [START ON 07/19/2017] LORazepam  0-4 mg Intravenous Q12H   Or  . [START ON 07/19/2017] LORazepam  0-4 mg Oral Q12H  . nicotine  21 mg Transdermal Daily  . thiamine  100 mg Oral  Daily   Continuous Infusions:   LOS: 0 days    Time spent: 40 minutes    Cataldo Cosgriff, Roselind Messier, MD [facility] Pager [phone]   If 7PM-7AM, please contact night-coverage [email or web address] Password [email or web address] 07/18/2017, 9:35 PM

## 2017-07-18 NOTE — Progress Notes (Signed)
CSW will begin search for inpatient psych facility. Please document when patient is medically stable for transfer.   Osborne Casco Tennis Mckinnon LCSWA [phone]

## 2017-07-18 NOTE — Progress Notes (Signed)
CSW received consult to call patient's wife. CSW spoke with her. She wanted to know which psych facility patient would be going to and if he would be getting help for his substance use. CSW spoke with patient's wife in general terms about the process, since she was already aware patient would go to inpatient psych. She asked CSW to let her know when everything had been decided. CSW will update her at discharge if patient gives permission.   Jared Robinson Jared Robinson LCSWA [phone]

## 2017-07-19 ENCOUNTER — Inpatient Hospital Stay (HOSPITAL_COMMUNITY)
Admission: AD | Admit: 2017-07-19 | Discharge: 2017-07-25 | DRG: 885 | Disposition: A | Discharge status: Home / Self Care | Payer: BLUE CROSS/BLUE SHIELD | Source: Intra-hospital | Attending: Psychiatry | Admitting: Psychiatry

## 2017-07-19 ENCOUNTER — Other Ambulatory Visit: Payer: Self-pay

## 2017-07-19 ENCOUNTER — Encounter (HOSPITAL_COMMUNITY): Payer: Self-pay | Admitting: *Deleted

## 2017-07-19 DIAGNOSIS — Z79899 Other long term (current) drug therapy: Secondary | ICD-10-CM

## 2017-07-19 DIAGNOSIS — Z62810 Personal history of physical and sexual abuse in childhood: Secondary | ICD-10-CM | POA: Diagnosis present

## 2017-07-19 DIAGNOSIS — K589 Irritable bowel syndrome without diarrhea: Secondary | ICD-10-CM | POA: Diagnosis present

## 2017-07-19 DIAGNOSIS — F4325 Adjustment disorder with mixed disturbance of emotions and conduct: Secondary | ICD-10-CM | POA: Diagnosis not present

## 2017-07-19 DIAGNOSIS — Z886 Allergy status to analgesic agent status: Secondary | ICD-10-CM | POA: Diagnosis not present

## 2017-07-19 DIAGNOSIS — Z7141 Alcohol abuse counseling and surveillance of alcoholic: Secondary | ICD-10-CM

## 2017-07-19 DIAGNOSIS — R0789 Other chest pain: Secondary | ICD-10-CM | POA: Diagnosis present

## 2017-07-19 DIAGNOSIS — Z7151 Drug abuse counseling and surveillance of drug abuser: Secondary | ICD-10-CM | POA: Diagnosis not present

## 2017-07-19 DIAGNOSIS — R45851 Suicidal ideations: Secondary | ICD-10-CM | POA: Diagnosis present

## 2017-07-19 DIAGNOSIS — F419 Anxiety disorder, unspecified: Secondary | ICD-10-CM | POA: Diagnosis present

## 2017-07-19 DIAGNOSIS — F1010 Alcohol abuse, uncomplicated: Secondary | ICD-10-CM | POA: Diagnosis not present

## 2017-07-19 DIAGNOSIS — G8929 Other chronic pain: Secondary | ICD-10-CM | POA: Diagnosis not present

## 2017-07-19 DIAGNOSIS — Z716 Tobacco abuse counseling: Secondary | ICD-10-CM | POA: Diagnosis not present

## 2017-07-19 DIAGNOSIS — F322 Major depressive disorder, single episode, severe without psychotic features: Principal | ICD-10-CM | POA: Diagnosis present

## 2017-07-19 DIAGNOSIS — Z8249 Family history of ischemic heart disease and other diseases of the circulatory system: Secondary | ICD-10-CM | POA: Diagnosis not present

## 2017-07-19 DIAGNOSIS — F1910 Other psychoactive substance abuse, uncomplicated: Secondary | ICD-10-CM | POA: Diagnosis not present

## 2017-07-19 DIAGNOSIS — F1410 Cocaine abuse, uncomplicated: Secondary | ICD-10-CM | POA: Diagnosis not present

## 2017-07-19 DIAGNOSIS — J449 Chronic obstructive pulmonary disease, unspecified: Secondary | ICD-10-CM | POA: Diagnosis not present

## 2017-07-19 DIAGNOSIS — R450 Nervousness: Secondary | ICD-10-CM | POA: Diagnosis not present

## 2017-07-19 DIAGNOSIS — F17210 Nicotine dependence, cigarettes, uncomplicated: Secondary | ICD-10-CM | POA: Diagnosis present

## 2017-07-19 DIAGNOSIS — F1210 Cannabis abuse, uncomplicated: Secondary | ICD-10-CM | POA: Diagnosis not present

## 2017-07-19 DIAGNOSIS — F4010 Social phobia, unspecified: Secondary | ICD-10-CM | POA: Diagnosis not present

## 2017-07-19 DIAGNOSIS — G4700 Insomnia, unspecified: Secondary | ICD-10-CM | POA: Diagnosis present

## 2017-07-19 LAB — BASIC METABOLIC PANEL
Anion gap: 8 (ref 5–15)
BUN: 12 mg/dL (ref 6–20)
CO2: 27 mmol/L (ref 22–32)
Calcium: 9 mg/dL (ref 8.9–10.3)
Chloride: 99 mmol/L — ABNORMAL LOW (ref 101–111)
Creatinine, Ser: 1.05 mg/dL (ref 0.61–1.24)
GFR calc non Af Amer: >60 mL/min (ref >60)
Glucose, Bld: 93 mg/dL (ref 65–99)
POTASSIUM: 4 mmol/L (ref 3.5–5.1)
SODIUM: 134 mmol/L — AB (ref 135–145)

## 2017-07-19 LAB — MAGNESIUM: MAGNESIUM: 2.2 mg/dL (ref 1.7–2.4)

## 2017-07-19 MED ORDER — LISINOPRIL 2.5 MG PO TABS: 2.5000 mg | ORAL_TABLET | Freq: Every day | ORAL | Status: DC

## 2017-07-19 MED ORDER — LORAZEPAM 1 MG PO TABS
1.0000 mg | ORAL_TABLET | Freq: Three times a day (TID) | ORAL | Status: AC
  Administered 2017-07-19 – 2017-07-20 (×4): 1 mg via ORAL
  Filled 2017-07-19 (×4): qty 1

## 2017-07-19 MED ORDER — LORAZEPAM 1 MG PO TABS: 1.0000 mg | ORAL_TABLET | Freq: Two times a day (BID) | ORAL | Status: DC

## 2017-07-19 MED ORDER — NICOTINE 21 MG/24HR TD PT24
21.0000 mg | MEDICATED_PATCH | Freq: Every day | TRANSDERMAL | Status: DC
  Administered 2017-07-20 – 2017-07-25 (×6): 21 mg via TRANSDERMAL
  Filled 2017-07-19 (×8): qty 1

## 2017-07-19 MED ORDER — NICOTINE 21 MG/24HR TD PT24: 21.0000 mg | MEDICATED_PATCH | Freq: Every day | TRANSDERMAL | 0 refills | Status: DC

## 2017-07-19 MED ORDER — GABAPENTIN 300 MG PO CAPS
300.0000 mg | ORAL_CAPSULE | Freq: Three times a day (TID) | ORAL | Status: DC
  Administered 2017-07-19 – 2017-07-23 (×12): 300 mg via ORAL
  Filled 2017-07-19 (×18): qty 1

## 2017-07-19 MED ORDER — ACETAMINOPHEN 325 MG PO TABS: 650.0000 mg | ORAL_TABLET | Freq: Four times a day (QID) | ORAL | Status: DC | PRN

## 2017-07-19 MED ORDER — LORAZEPAM 1 MG PO TABS
0.0000 mg | ORAL_TABLET | Freq: Two times a day (BID) | ORAL | Status: DC
  Administered 2017-07-19: 2 mg via ORAL
  Filled 2017-07-19: qty 2

## 2017-07-19 MED ORDER — LORAZEPAM 2 MG/ML IJ SOLN: 0.0000 mg | Freq: Two times a day (BID) | INTRAMUSCULAR | Status: DC

## 2017-07-19 MED ORDER — METOPROLOL TARTRATE 12.5 MG HALF TABLET
12.5000 mg | ORAL_TABLET | Freq: Two times a day (BID) | ORAL | Status: DC
  Administered 2017-07-19: 12.5 mg via ORAL
  Filled 2017-07-19: qty 1

## 2017-07-19 MED ORDER — LORAZEPAM 1 MG PO TABS
1.0000 mg | ORAL_TABLET | Freq: Four times a day (QID) | ORAL | Status: AC | PRN
  Administered 2017-07-20 – 2017-07-22 (×5): 1 mg via ORAL
  Filled 2017-07-19 (×6): qty 1

## 2017-07-19 MED ORDER — LISINOPRIL 5 MG PO TABS
2.5000 mg | ORAL_TABLET | Freq: Every day | ORAL | Status: DC
  Administered 2017-07-19: 2.5 mg via ORAL
  Filled 2017-07-19: qty 1

## 2017-07-19 MED ORDER — THIAMINE HCL 100 MG PO TABS: 100.0000 mg | ORAL_TABLET | Freq: Every day | ORAL | Status: DC

## 2017-07-19 MED ORDER — NITROGLYCERIN 0.4 MG SL SUBL
0.4000 mg | SUBLINGUAL_TABLET | SUBLINGUAL | Status: AC | PRN
  Administered 2017-07-20: 0.4 mg via SUBLINGUAL
  Filled 2017-07-19: qty 1

## 2017-07-19 MED ORDER — HYDROXYZINE HCL 25 MG PO TABS
25.0000 mg | ORAL_TABLET | Freq: Four times a day (QID) | ORAL | Status: AC | PRN
  Administered 2017-07-19 – 2017-07-21 (×3): 25 mg via ORAL
  Filled 2017-07-19 (×3): qty 1

## 2017-07-19 MED ORDER — METOPROLOL TARTRATE 25 MG PO TABS
ORAL_TABLET | ORAL | Status: AC
  Administered 2017-07-19: 12.5 mg via ORAL
  Filled 2017-07-19: qty 1

## 2017-07-19 MED ORDER — VITAMIN B-1 100 MG PO TABS
100.0000 mg | ORAL_TABLET | Freq: Every day | ORAL | Status: DC
  Administered 2017-07-20 – 2017-07-25 (×6): 100 mg via ORAL
  Filled 2017-07-19 (×8): qty 1

## 2017-07-19 MED ORDER — LORAZEPAM 1 MG PO TABS: 1.0000 mg | ORAL_TABLET | Freq: Every day | ORAL | Status: DC

## 2017-07-19 MED ORDER — METOPROLOL TARTRATE 12.5 MG HALF TABLET
12.5000 mg | ORAL_TABLET | Freq: Two times a day (BID) | ORAL | Status: DC
  Administered 2017-07-19 – 2017-07-25 (×12): 12.5 mg via ORAL
  Filled 2017-07-19 (×17): qty 1

## 2017-07-19 MED ORDER — FOLIC ACID 1 MG PO TABS: 1.0000 mg | ORAL_TABLET | Freq: Every day | ORAL | Status: DC

## 2017-07-19 MED ORDER — LISINOPRIL 2.5 MG PO TABS
2.5000 mg | ORAL_TABLET | Freq: Every day | ORAL | Status: DC
  Administered 2017-07-20 – 2017-07-23 (×4): 2.5 mg via ORAL
  Filled 2017-07-19 (×6): qty 1

## 2017-07-19 MED ORDER — LOPERAMIDE HCL 2 MG PO CAPS: 2.0000 mg | ORAL_CAPSULE | ORAL | Status: AC | PRN

## 2017-07-19 MED ORDER — METOPROLOL TARTRATE 25 MG PO TABS: 12.5000 mg | ORAL_TABLET | Freq: Two times a day (BID) | ORAL | Status: DC

## 2017-07-19 MED ORDER — ONDANSETRON 4 MG PO TBDP: 4.0000 mg | ORAL_TABLET | Freq: Four times a day (QID) | ORAL | Status: AC | PRN

## 2017-07-19 MED ORDER — GABAPENTIN 600 MG PO TABS: 300.0000 mg | ORAL_TABLET | Freq: Three times a day (TID) | ORAL | Status: DC

## 2017-07-19 MED ORDER — ENSURE ENLIVE PO LIQD
237.0000 mL | Freq: Two times a day (BID) | ORAL | Status: DC
  Administered 2017-07-20 – 2017-07-25 (×9): 237 mL via ORAL

## 2017-07-19 MED ORDER — TRAZODONE HCL 50 MG PO TABS
50.0000 mg | ORAL_TABLET | Freq: Every evening | ORAL | Status: DC | PRN
  Administered 2017-07-19 – 2017-07-22 (×5): 50 mg via ORAL
  Filled 2017-07-19 (×14): qty 1

## 2017-07-19 NOTE — Progress Notes (Signed)
[facility] here to pick up patient. IV d/c'd.

## 2017-07-19 NOTE — BHH Counselor (Signed)
Patient became agitated a short time ago after he attempted to walk over to another hallway and was confronted by one of the mental health technicians. The patient has been agitated since the outset of this shift.

## 2017-07-19 NOTE — Discharge Summary (Signed)
DISCHARGE SUMMARY  Jared Robinson  MR#: [id number]  DOB:[birth year]  Date of Admission: 07/16/2017 Date of Discharge: 07/19/2017  Attending Physician:Jeffrey Silvestre Gunner, MD  Patient's OMV:[id number], Windy Fast, MD  Consults: Psychiatry   Disposition: D/C to Behavioral Health  Follow-up Appts: -pt should be referred to his Primary Care Physician after he completes his tx at [facility]  Tests Needing Follow-up: -once rehab/Psych stabilization completed pt can be referred to outpt Cards for follow-up  -repeat TTE is suggested in 2-3 months to f/u modestly impaired EF felt to be due to substance abuse -monitor BP and HR as pt newly started on ACEi and BB this hospital stay -monitor renal fxn intermittently as pt newly started on ACEi this hospital stay    Discharge Diagnoses: Polysubstance abuse COPD Acutely diagnosed systolic CHF - well compensated  Chest pain - due to cocaine abuse (demand ischemia) Suicidal / homicidal ideation  Initial presentation: 47 y.o.M w/ a Hx of Polysubstance abuse (Cocaine, EtOH,Tobacco), COPD, and IBS who presented to the ER w/ 2 months of chest pain off and on, coincident w/ using cocaine.  In addition patient admitted to be having suicidal ideation due to family issues / pending divorce.   In the ER EKG noted normal sinus rhythm with nonspecific changes with chest x-ray showing COPD findings only.   Hospital Course:  Polysubstance abuse 1/1 UDS positive cocaine and marijuana - Dr. Joseph Art spoke at length with patient concerning the absolute need to abstain from cocaine, marijuana, tobacco, and EtOH and that continuing to use these substances increased risk for MI, stroke, and DEATH - Gabapentin 300 mg TID utilized per Psychiatry recommendation   COPD Well compensated w/o acute exacerbation   Acutely diagnosed systolic CHF of unclear chronicity  TTE this admit notes EF 40-45% but w/ pattern of diffuse hypokinesis and no WMA - most likely secondary to  cocaine and EtOH use - priority at present should be placed on correcting the offending behavior - once rehab/Psych stabilization completed pt can then be referred to outpt Cards for follow-up - low dose ACEi and BB initiated during this hospital stay    Chest pain Troponin x3 negative - felt to be due to cocaine abuse - no acute findings on EKG - consider outpt risk stratification once psychiatric issues stabilized - medically cleared for d/c at this time   Suicidal / homicidal ideation Evaluated by Psychiatry w/ recommendation for inpt psychiatric tx/stabilization - pt presently medically cleared for transfer to psych tx   Allergies as of 07/19/2017      Reactions   Aspirin Rash      Medication List    TAKE these medications   acetaminophen 325 MG tablet Commonly known as:  TYLENOL Take 2 tablets (650 mg total) by mouth every 6 (six) hours as needed for mild pain (or Fever >/= 101).   folic acid 1 MG tablet Commonly known as:  FOLVITE Take 1 tablet (1 mg total) by mouth daily. Start taking on:  07/20/2017   gabapentin 600 MG tablet Commonly known as:  NEURONTIN Take 0.5 tablets (300 mg total) by mouth 3 (three) times daily.   lisinopril 2.5 MG tablet Commonly known as:  PRINIVIL,ZESTRIL Take 1 tablet (2.5 mg total) by mouth daily.   metoprolol tartrate 25 MG tablet Commonly known as:  LOPRESSOR Take 0.5 tablets (12.5 mg total) by mouth 2 (two) times daily.   nicotine 21 mg/24hr patch Commonly known as:  NICODERM CQ - dosed in mg/24 hours Place 1 patch (  21 mg total) onto the skin daily. Start taking on:  07/20/2017   thiamine 100 MG tablet Take 1 tablet (100 mg total) by mouth daily. Start taking on:  07/20/2017       Day of Discharge BP (!) 114/95   Pulse 78   Temp 97.8 F (36.6 C) (Oral)   Resp 12   Ht 5\' 10"  (1.778 m)   Wt 73.4 kg (161 lb 12.8 oz)   SpO2 95%   BMI 23.22 kg/m   Physical Exam: General: No acute respiratory distress Lungs: Clear to  auscultation bilaterally without wheezes or crackles Cardiovascular: Regular rate and rhythm without murmur gallop or rub normal S1 and S2 Abdomen: Nontender, nondistended, soft, bowel sounds positive, no rebound, no ascites, no appreciable mass Extremities: No significant cyanosis, clubbing, or edema bilateral lower extremities  Basic Metabolic Panel: Recent Labs  Lab 07/16/17 1330 07/16/17 2120 07/17/17 0338 07/18/17 0441 07/19/17 0414  NA 137  --  134* 136 134*  K 4.4  --  3.9 4.2 4.0  CL 100*  --  102 102 99*  CO2 26  --  25 26 27   GLUCOSE 91  --  94 101* 93  BUN 6  --  12 10 12   CREATININE 1.03 1.05 1.03 1.07 1.05  CALCIUM 9.5  --  8.8* 9.2 9.0  MG  --  2.1  --  2.2 2.2  PHOS  --   --   --  3.6  --     Liver Function Tests: Recent Labs  Lab 07/16/17 1330 07/16/17 2120 07/18/17 0441  AST 21 20 16   ALT 16* 14* 13*  ALKPHOS 59 52 51  BILITOT 0.9 1.0 1.1  PROT 7.8 6.7 7.0  ALBUMIN 4.7 3.9 4.0    CBC: Recent Labs  Lab 07/16/17 1324 07/16/17 2120 07/17/17 0338 07/18/17 0441  WBC 9.6 7.5 6.6 7.1  HGB 17.4* 15.9 15.4 17.0  HCT 51.3 45.6 46.3 50.5  MCV 89.8 88.9 90.1 90.3  PLT 301 250 254 263    Cardiac Enzymes: Recent Labs  Lab 07/16/17 2120 07/17/17 0338 07/17/17 1030  TROPONINI <0.03 <0.03 <0.03    Time spent in discharge (includes decision making & examination of pt): 30 minutes  07/19/2017, 11:29 AM   Lonia Blood, MD [facility] Office  [phone] Pager [phone]  On-Call/Text Page:      [email or web address]      password [facility]

## 2017-07-19 NOTE — Progress Notes (Signed)
CSW updated patient on plan to transfer to [facility] today. He reports understanding and asked appropriate questions about the plan. He signed the voluntary consent form and CSW faxed that to [facility]. He requested CSW contact his wife and update her as well. Patient's wife appreciated plan information and expressed frustration over the patient's behavior to CSW. CSW provided supportive listening.  [name] LCSWA [phone]

## 2017-07-19 NOTE — Progress Notes (Addendum)
Rasul is a 47 year old male being admitted voluntarily to 300-2 from [facility] floor.  He came to the ED with chest pain and suicidal ideation.  He was medically admitted.  He reported stressors as his wife found out about his extra marital affairs and believes his marriage is over.  He has been using cocaine, marijuana and alcohol to self medicate.  During [facility] admission, he continues to voice suicidal ideation and will contract for safety on the unit.  He had his head down and was very quiet during admission.  He was not very forth coming with answering questions.  He was noted to be unsteady on his feet requiring assistance to walk at times.  He denies HI or A/V hallucinations.  Oriented him to the unit.  Admission paperwork completed and signed.  Belongings searched and secured in locker # 29.  Skin assessment completed and no skin issues noted.  Q 15 minute checks initiated for safety.  We will monitor the progress towards his goals.

## 2017-07-19 NOTE — Progress Notes (Signed)
Patient will DC to: [facility] Anticipated DC date: 07/19/17 Family notified: Wife as requested by pt Transport by: Juel Burrow 2pm  Room 300 bed 2, accepting Md is Cobos  Per MD patient ready for DC to [facility]. RN, patient, patient's family, and facility notified of DC. Voluntary consent sent to facility. RN to call report before patient leaves: [phone]  CSW signing off.  Cristobal Goldmann, [place] Clinical Social Worker [phone]

## 2017-07-19 NOTE — Progress Notes (Signed)
D   Pt is irritable and being manipulative     He was suspected of putting gum in the medication window sill which could have prevented it from closing if not observed when I attempted to close it    Pt tried to getting  medications from another nurse  On another hall   He became agitated when he could not get another nicotine patch at bedtime    He was instructed that a fresh patch would be put on him during the morning and received a piece of gum to help with the craving  A   Verbal support given   Medications administered and effectiveness monitored   Q 15 min checks  R   Pt is safe at present time

## 2017-07-19 NOTE — Tx Team (Signed)
Initial Treatment Plan 07/19/2017 11:10 PM RON HAAB STM:[id number]    PATIENT STRESSORS: Health problems Marital or family conflict Substance abuse   PATIENT STRENGTHS: Capable of independent living General fund of knowledge   PATIENT IDENTIFIED PROBLEMS: Substance abuse  Suicidal ideation  Depression  "Feel better"               DISCHARGE CRITERIA:  Improved stabilization in mood, thinking, and/or behavior Verbal commitment to aftercare and medication compliance Withdrawal symptoms are absent or subacute and managed without 24-hour nursing intervention  PRELIMINARY DISCHARGE PLAN: Outpatient therapy Medication management  PATIENT/FAMILY INVOLVEMENT: This treatment plan has been presented to and reviewed with the patient, Jared Robinson.  The patient and family have been given the opportunity to ask questions and make suggestions.  Levin Bacon, RN 07/19/2017, 11:10 PM

## 2017-07-19 NOTE — Progress Notes (Signed)
Report given to [facility] at [facility].

## 2017-07-19 NOTE — Progress Notes (Signed)
Chaplain who covered the Hospital on the evening of 03 December spent an hour with patient in a Pastoral visit where the patient's sharing, and recounting of life events was central. Patient was effusive and confessional. Chaplain offered patient empathic listening.

## 2017-07-20 DIAGNOSIS — F419 Anxiety disorder, unspecified: Secondary | ICD-10-CM

## 2017-07-20 DIAGNOSIS — G4700 Insomnia, unspecified: Secondary | ICD-10-CM

## 2017-07-20 DIAGNOSIS — F17210 Nicotine dependence, cigarettes, uncomplicated: Secondary | ICD-10-CM

## 2017-07-20 DIAGNOSIS — R450 Nervousness: Secondary | ICD-10-CM

## 2017-07-20 DIAGNOSIS — F4325 Adjustment disorder with mixed disturbance of emotions and conduct: Secondary | ICD-10-CM

## 2017-07-20 DIAGNOSIS — F1410 Cocaine abuse, uncomplicated: Secondary | ICD-10-CM

## 2017-07-20 DIAGNOSIS — F4010 Social phobia, unspecified: Secondary | ICD-10-CM

## 2017-07-20 DIAGNOSIS — F1210 Cannabis abuse, uncomplicated: Secondary | ICD-10-CM

## 2017-07-20 MED ORDER — NITROGLYCERIN 0.4 MG SL SUBL: 0.4000 mg | SUBLINGUAL_TABLET | SUBLINGUAL | Status: DC | PRN

## 2017-07-20 MED ORDER — NITROGLYCERIN 0.4 MG SL SUBL
0.4000 mg | SUBLINGUAL_TABLET | SUBLINGUAL | Status: AC | PRN
  Administered 2017-07-20 (×2): 0.4 mg via SUBLINGUAL

## 2017-07-20 MED ORDER — NITROGLYCERIN 0.4 MG SL SUBL
0.4000 mg | SUBLINGUAL_TABLET | SUBLINGUAL | Status: DC | PRN
  Administered 2017-07-21: 0.4 mg via SUBLINGUAL

## 2017-07-20 NOTE — BHH Group Notes (Signed)
Identifying Needs   Date:  07/20/2017  Time:  7:37 PM  Type of Therapy:  Nurse Education  :  Identifying Needs :  The group focuses on teaching patients how to identify their needs and then hwo to develop skills needed to get them met.  Participation Level:  Active  Participation Quality:  Attentive  Affect:  Appropriate  Cognitive:  Alert  Insight:  Good  Engagement in Group:  Engaged  Modes of Intervention:  Education  Summary of Progress/Problems:  Jared Robinson 07/20/2017, 7:37 PM

## 2017-07-20 NOTE — Progress Notes (Signed)
BHH Group Notes:  (Nursing/MHT/Case Management/Adjunct)  Date:  07/20/2017  Time:  2045  Type of Therapy:  wrap up group  Participation Level:  Active  Participation Quality:  Appropriate, Attentive, Sharing and Supportive  Affect:  Appropriate and Irritable  Cognitive:  Appropriate  Insight:  Improving  Engagement in Group:  Engaged  Modes of Intervention:  Clarification, Education and Support  Summary of Progress/Problems:  Jared Robinson 07/20/2017, 9:12 PM

## 2017-07-20 NOTE — [facility] Group Notes (Signed)
Adult Group Therapy Note  Date:  07/20/2017   Time: 9:30AM-10:30AM  Group Topic:  Substance Abuse and How It Affects Mental Health  Group Focus: Today's group focused on the topic of how substance use has negatively affected patients' lives and reasons they may want to change.  DSM 5 criteria to determine a substance use disorder were reviewed when patients started to express feeling resentment when told they are addicts.  Patients in the room each then shared that under those definitions, they are an addict.  They talked about how their substance use affects their mental health and thus their life and relationships.  We discussed motivations to change and how a person is most successful when they actually want to change for themselves, not something external.    Therapeutic Goals: 1. Patient will identify ways in which their substance abuse negatively affects their life 2. Patient will identify one or more reasons they feel that a change in behavior would be beneficial 3. Patient will state a plan to change the above identified behavior 4. Patient will demonstrate ability to communicate their needs through discussion   Participation Level:  Active  Modes of Intervention: Motivational Interviewing, Education  Additional Comments:  The patient expressed that the first time he was hospitalized he was unable to identify with those who had been here 2-4 times, and said he would never return.  He stated his wife does not understand how he craved cocaine every day of 10 years he was sober.  He stated he almost always refuses to go places with his family, because he is going to stay home and use cocaine instead.  He stated he wants to change.   Lynnell Chad, LCSW

## 2017-07-20 NOTE — Progress Notes (Signed)
D   Pt is pleasant on approach and cooperative    He has been appropriate in his interactions with others   Spoke to his wife and she was concerned that he may get out of the hospital too early and she wants him to get in some type of rehab program   She was encouraged to speak with the case manager for that information A    Verbal support given   Medications administered and effectiveness monitored    Q 15 min checks R    Pt is safe at present time
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
## 2017-07-20 NOTE — Progress Notes (Signed)
D: Pt rates his depression at a 5, hopelessness at a 5 and his anxiety at a 10. Pt has attended the program and interacts with his peers appropriately. Pt was upset this morning over another pt from the 500 hall that was loud and intrusive with him. Came to this Clinical research associate and said, "I am really upset that this woman said something to me". Has complained of chest pain twice today stating "it feels as though someone is squeezing my chest".  A) Pt given support, reassurance and praise. Provided with a 1:1 on several occassions today when he was getting upset and feeling disrespected. Pt was given Nitroglycerin twice today. Once at 1658 and again at 1830.  R) Both times nitro was given, pt was relieved of chest pain. Pt denies SI and HI.

## 2017-07-21 ENCOUNTER — Other Ambulatory Visit: Payer: Self-pay

## 2017-07-21 DIAGNOSIS — F1910 Other psychoactive substance abuse, uncomplicated: Secondary | ICD-10-CM

## 2017-07-21 MED ORDER — ALUM & MAG HYDROXIDE-SIMETH 200-200-20 MG/5ML PO SUSP
30.0000 mL | Freq: Four times a day (QID) | ORAL | Status: DC | PRN
  Administered 2017-07-21: 30 mL via ORAL
  Filled 2017-07-21: qty 30

## 2017-07-21 MED ORDER — TRAMADOL HCL 50 MG PO TABS
50.0000 mg | ORAL_TABLET | Freq: Four times a day (QID) | ORAL | Status: DC | PRN
  Administered 2017-07-21: 50 mg via ORAL
  Filled 2017-07-21: qty 1

## 2017-07-21 MED ORDER — METOPROLOL TARTRATE 12.5 MG HALF TABLET
12.5000 mg | ORAL_TABLET | Freq: Once | ORAL | Status: AC
  Administered 2017-07-21: 12.5 mg via ORAL

## 2017-07-21 MED ORDER — LORAZEPAM 1 MG PO TABS
1.0000 mg | ORAL_TABLET | Freq: Once | ORAL | Status: AC
  Administered 2017-07-21: 1 mg via ORAL

## 2017-07-21 NOTE — Progress Notes (Signed)
D.  Pt appears very anxious on approach, continues to complaint of chest pain.  Pt states "It might be anxiety, my anxiety is through the roof right now".  Pt has had a difficult phone call and did leave group due to anxiety tonight.  Pt's BP 123/64 HR 110 at this time.  Pt denies SI/HI/AVH.  A.  NP notified of anxiety, chest pain, and VS.  New order received.  Support and encouragement offered to Pt.  R.  Pt given newly ordered medication and is observed sitting in dayroom watching TV and eating snacks.  No acute distress noted at this time.

## 2017-07-21 NOTE — Progress Notes (Signed)
D) Pt approached this writer stating, "My chest hurts". Affect was grimacing and Pt rated his pain at a 4. Hillery Jacks NP was standing in the hall and overheard the conversation and requested an EKG. A) EKG done per verbal order R) EKG reads normal sinus rhythm. Pt was given a prn Ativan for increased anxiety.

## 2017-07-21 NOTE — Progress Notes (Signed)
Patient did attend the evening speaker AA meeting.  
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
## 2017-07-21 NOTE — Progress Notes (Signed)
[facility] MD Progress Note  07/21/2017 9:50 AM THERMON RAMMEL  MRN:  [id number] Subjective:  Jared Robinson reports " I am feeling okay today I guess." - Patient continues report ongoing chest discomfort since last night. Reports some relief with Nitro.    Objective: TARIQUE SUDERMAN is awake, alert and oriented.  Seen resting in dayroom interacting with peers. Patient has concerns with chest pain and is asymptomatic. Reports pain is located in the center of his chest and is "sharp." denies pain radiations, headaches or diaphoretic. NP consulted with MD Izola Price. B/P 122/80 HR 88 EKG: Normal Sinuses Vent Rate:88, PR interval 126, QRS duration 82   ( see consult note)    Denies suicidal or homicidal ideation during this assessment. Reports ongoing depression.  Denies auditory or visual hallucination and does not appear to be responding to internal stimuli.  Patient reports he is medication compliant without mediation side effects. Support, encouragement and reassurance was provided.   Principal Problem: MDD (major depressive disorder), severe (HCC) Diagnosis:   Patient Active Problem List   Diagnosis Date Noted  . MDD (major depressive disorder), severe (HCC) [F32.2] 07/19/2017  . Adjustment disorder with mixed disturbance of emotions and conduct [F43.25]   . Polysubstance abuse (HCC) [F19.10] 07/16/2017  . Suicidal behavior [R46.89] 07/16/2017   Total Time spent with patient: 20 minutes  Past Psychiatric History:   Past Medical History:  Past Medical History:  Diagnosis Date  . Anxiety   . Chronic bronchitis (HCC)   . Chronic lower back pain   . COPD (chronic obstructive pulmonary disease) (HCC)   . History of blood transfusion 1984   related to "tonsillectomy" (07/17/2017)  . IBS (irritable bowel syndrome)     Past Surgical History:  Procedure Laterality Date  . EYE SURGERY Right    "fell out of crib when I was a baby; busted it open"  . HAND SURGERY Left 2000   "degloving injury; vein graft, skin  graft"  . HERNIA REPAIR    . INGUINAL HERNIA REPAIR Left   . LUMBAR LAMINECTOMY    . TONSILLECTOMY  1984  . UMBILICAL HERNIA REPAIR     Family History:  Family History  Problem Relation Age of Onset  . Hypertension Other    Family Psychiatric  History: Social History:  Social History   Substance and Sexual Activity  Alcohol Use Yes  . Alcohol/week: 50.4 oz  . Types: 84 Cans of beer per week   Comment: 07/17/2017 "12 beers or more day "     Social History   Substance and Sexual Activity  Drug Use Yes  . Types: Cocaine, Marijuana    Social History   Socioeconomic History  . Marital status: Married    Spouse name: None  . Number of children: None  . Years of education: None  . Highest education level: None  Social Needs  . Financial resource strain: None  . Food insecurity - worry: None  . Food insecurity - inability: None  . Transportation needs - medical: None  . Transportation needs - non-medical: None  Occupational History  . None  Tobacco Use  . Smoking status: Current Every Day Smoker    Packs/day: 1.00    Years: 29.00    Pack years: 29.00    Types: Cigarettes  . Smokeless tobacco: Never Used  Substance and Sexual Activity  . Alcohol use: Yes    Alcohol/week: 50.4 oz    Types: 84 Cans of beer per week  Comment: 07/17/2017 "12 beers or more day "  . Drug use: Yes    Types: Cocaine, Marijuana  . Sexual activity: Not Currently  Other Topics Concern  . None  Social History Narrative  . None   Additional Social History:                         Sleep: Fair  Appetite:  Fair  Current Medications: Current Facility-Administered Medications  Medication Dose Route Frequency Provider Last Rate Last Dose  . alum & mag hydroxide-simeth (MAALOX/MYLANTA) 200-200-20 MG/5ML suspension 30 mL  30 mL Oral Q6H PRN Izediuno, Delight Ovens, MD   30 mL at 07/21/17 0825  . feeding supplement (ENSURE ENLIVE) (ENSURE ENLIVE) liquid 237 mL  237 mL Oral BID BM  Rankin, Shuvon B, NP   237 mL at 07/20/17 1702  . gabapentin (NEURONTIN) capsule 300 mg  300 mg Oral TID Rankin, Shuvon B, NP   300 mg at 07/21/17 0807  . hydrOXYzine (ATARAX/VISTARIL) tablet 25 mg  25 mg Oral Q6H PRN Nira Conn A, NP   25 mg at 07/20/17 2139  . lisinopril (PRINIVIL,ZESTRIL) tablet 2.5 mg  2.5 mg Oral Daily Rankin, Shuvon B, NP   2.5 mg at 07/21/17 0806  . loperamide (IMODIUM) capsule 2-4 mg  2-4 mg Oral PRN Nira Conn A, NP      . LORazepam (ATIVAN) tablet 1 mg  1 mg Oral Q6H PRN Nira Conn A, NP   1 mg at 07/21/17 0844  . metoprolol tartrate (LOPRESSOR) tablet 12.5 mg  12.5 mg Oral BID Rankin, Shuvon B, NP   12.5 mg at 07/21/17 0807  . nicotine (NICODERM CQ - dosed in mg/24 hours) patch 21 mg  21 mg Transdermal Daily Rankin, Shuvon B, NP   21 mg at 07/21/17 0807  . nitroGLYCERIN (NITROSTAT) SL tablet 0.4 mg  0.4 mg Sublingual Q5 min PRN Oneta Rack, NP      . ondansetron (ZOFRAN-ODT) disintegrating tablet 4 mg  4 mg Oral Q6H PRN Nira Conn A, NP      . thiamine (VITAMIN B-1) tablet 100 mg  100 mg Oral Daily Rankin, Shuvon B, NP   100 mg at 07/21/17 0807  . traZODone (DESYREL) tablet 50 mg  50 mg Oral QHS,MR X 1 Nira Conn A, NP   50 mg at 07/20/17 2140    Lab Results: No results found for this or any previous visit (from the past 48 hour(s)).  Blood Alcohol level:  Lab Results  Component Value Date   ETH <10 07/16/2017   ETH  02/20/2010    5        LOWEST DETECTABLE LIMIT FOR SERUM ALCOHOL IS 5 mg/dL FOR MEDICAL PURPOSES ONLY    Metabolic Disorder Labs: Lab Results  Component Value Date   HGBA1C 5.0 07/18/2017   MPG 96.8 07/18/2017   No results found for: PROLACTIN Lab Results  Component Value Date   CHOL 178 07/18/2017   TRIG 160 (H) 07/18/2017   HDL 41 07/18/2017   CHOLHDL 4.3 07/18/2017   VLDL 32 07/18/2017   LDLCALC 105 (H) 07/18/2017    Physical Findings: AIMS: Facial and Oral Movements Muscles of Facial Expression: None,  normal Lips and Perioral Area: None, normal Jaw: None, normal Tongue: None, normal,Extremity Movements Upper (arms, wrists, hands, fingers): None, normal Lower (legs, knees, ankles, toes): None, normal, Trunk Movements Neck, shoulders, hips: None, normal, Overall Severity Severity of abnormal movements (highest score from  questions above): None, normal Incapacitation due to abnormal movements: None, normal Patient's awareness of abnormal movements (rate only patient's report): No Awareness, Dental Status Current problems with teeth and/or dentures?: No Does patient usually wear dentures?: No  CIWA:  CIWA-Ar Total: 2 COWS:  COWS Total Score: 5  Musculoskeletal: Strength & Muscle Tone: within normal limits Gait & Station: normal Patient leans: N/A  Psychiatric Specialty Exam: Physical Exam  Nursing note and vitals reviewed. Constitutional: He is oriented to person, place, and time. He appears well-developed and well-nourished.  Neurological: He is alert and oriented to person, place, and time.  Psychiatric: He has a normal mood and affect. His behavior is normal.    Review of Systems  Cardiovascular: Positive for chest pain. Negative for palpitations.       EKG ordered and reviewed. Internal medicine will be consulted: MD Izola Price to follow-up  Psychiatric/Behavioral: Positive for depression and substance abuse. Negative for suicidal ideas. The patient is nervous/anxious and has insomnia.   All other systems reviewed and are negative.   Blood pressure 122/80, pulse 97, temperature 98.3 F (36.8 C), temperature source Oral, resp. rate (!) 83, height 5\' 10"  (1.778 m), weight 73 kg (161 lb), SpO2 100 %.Body mass index is 23.1 kg/m.  General Appearance: Casual  Eye Contact:  Good  Speech:  Clear and Coherent  Volume:  Normal  Mood:  Anxious  Affect:  Congruent  Thought Process:  Coherent  Orientation:  Full (Time, Place, and Person)  Thought Content:  Rumination  Suicidal  Thoughts:  No  Homicidal Thoughts:  No  Memory:  Immediate;   Fair Recent;   Fair Remote;   Fair  Judgement:  Fair  Insight:  Fair  Psychomotor Activity:  Normal  Concentration:  Concentration: Fair  Recall:  [employer] of Knowledge:  Fair  Language:  Good  Akathisia:  No  Handed:  Right  AIMS (if indicated):     Assets:  Communication Skills Desire for Improvement Resilience Social Support  ADL's:  Intact  Cognition:  WNL  Sleep:  Number of Hours: 4.75     Treatment Plan Summary: Daily contact with patient to assess and evaluate symptoms and progress in treatment and Medication management   Continue will current treatment plan on 07/21/2016 except where noted  Chest discomfort:  - continue Nitro 0.4mg    - paged Internal Medicine for consult  Continue Gabapentin 300 mg PO BID for mood stabilization. Continue with Trazodone 50 mg for insomnia Started on CWIA/ Ativan Protocol Will continue to monitor vitals ,medication compliance and treatment side effects while patient is here.  Reviewed labs:,BAL -  UDS -  CSW will start working on disposition.  Patient to participate in therapeutic milieu   Oneta Rack, NP 07/21/2017, 9:50 AM

## 2017-07-21 NOTE — Consult Note (Signed)
Medical Consultation   Jared Robinson  NWG:[id number]  DOB: Oct 21, 1970  DOA: 07/19/2017  PCP: Burton Apley, MD   Requesting physician: Psychiatrist   Reason for consultation: Recurrent chest pain   History of Present Illness: Jared Robinson is an 47 y.o. male with known polysubstance use including tobacco, alcohol, cocaine, THC, currently at [facility], concerned with ongoing substernal chest pressure. Patient describes that pressure is mostly located on the left side of the upper chest area, squeezing type, lasts several seconds to minutes and spontaneously goes away. He denies any radiating symptoms, no diaphoresis, was given dose of nitroglycerin and it helped at first. He reports he was recently evaluated by cardiology for same concern and was told he needs outpatient follow up. ECHO was done and was notable for EF 40-45%, otherwise unremarkable. Pt reports having lots of stress at home and admits to being anxious when he starts thinking about this.       Review of Systems:  Constitutional: Negative for fever, chills, diaphoresis, activity change, appetite change and fatigue.  HENT: Negative for ear pain, nosebleeds, congestion, facial swelling, rhinorrhea, neck pain, neck stiffness and ear discharge.   Eyes: Negative for pain, discharge, redness, itching and visual disturbance.  Respiratory: Negative for cough, choking, chest tightness, shortness of breath, wheezing and stridor.   Cardiovascular: Negative for palpitations and leg swelling.  Gastrointestinal: Negative for abdominal distention.  Genitourinary: Negative for dysuria, urgency, frequency, hematuria, flank pain, decreased urine volume, difficulty urinating and dyspareunia.  Musculoskeletal: Negative for back pain, joint swelling, arthralgias and gait problem.  Neurological: Negative for dizziness, tremors, seizures, syncope, facial asymmetry Hematological: Negative for adenopathy. Does not bruise/bleed  easily.  Psychiatric/ Behavioral: Negative for hallucinations, confusion, dysphoric mood  Past Medical History: Past Medical History:  Diagnosis Date  . Anxiety   . Chronic bronchitis (HCC)   . Chronic lower back pain   . COPD (chronic obstructive pulmonary disease) (HCC)   . History of blood transfusion 1984   related to "tonsillectomy" (07/17/2017)  . IBS (irritable bowel syndrome)     Past Surgical History: Past Surgical History:  Procedure Laterality Date  . EYE SURGERY Right    "fell out of crib when I was a baby; busted it open"  . HAND SURGERY Left 2000   "degloving injury; vein graft, skin graft"  . HERNIA REPAIR    . INGUINAL HERNIA REPAIR Left   . LUMBAR LAMINECTOMY    . TONSILLECTOMY  1984  . UMBILICAL HERNIA REPAIR     Allergies:   Allergies  Allergen Reactions  . Aspirin Rash   Social History:  reports that he has been smoking cigarettes.  He has a 29.00 pack-year smoking history. he has never used smokeless tobacco. He reports that he drinks about 50.4 oz of alcohol per week. He reports that he uses drugs. Drugs: Cocaine and Marijuana.  Family History: Family History  Problem Relation Age of Onset  . Hypertension Other     Physical Exam: Vitals:   07/20/17 2050 07/20/17 2051 07/21/17 0635 07/21/17 0636  BP: 112/82 115/73 102/73 122/80  Pulse: 94 98 83 97  Resp:   (!) 83   Temp:   98.3 F (36.8 C)   TempSrc:   Oral   SpO2:      Weight:      Height:       Physical Exam  Constitutional: Appears well-developed and well-nourished. No  distress.  HENT: Normocephalic. External right and left ear normal. Oropharynx is clear and moist.  Eyes: Conjunctivae and EOM are normal. PERRLA, no scleral icterus.  Neck: Normal ROM. Neck supple. No JVD. No tracheal deviation. No thyromegaly.  CVS: RRR, S1/S2 +, no murmurs, no gallops, no carotid bruit.  Pulmonary: Effort and breath sounds normal, no stridor, rhonchi, wheezes, rales.  Abdominal: Soft. BS +,  no  distension, tenderness, rebound or guarding.  Musculoskeletal: Normal range of motion. No edema and no tenderness.  Lymphadenopathy: No lymphadenopathy noted, cervical, inguinal. Neuro: Alert. Normal reflexes, muscle tone coordination. No cranial nerve deficit. Skin: Skin is warm and dry. No rash noted. Not diaphoretic. No erythema. No pallor.  Psychiatric: Normal mood and affect. Behavior, judgment, thought content normal.   Data reviewed:  I have personally reviewed following labs and imaging studies Labs:  CBC: Recent Labs  Lab 07/16/17 1324 07/16/17 2120 07/17/17 0338 07/18/17 0441  WBC 9.6 7.5 6.6 7.1  HGB 17.4* 15.9 15.4 17.0  HCT 51.3 45.6 46.3 50.5  MCV 89.8 88.9 90.1 90.3  PLT 301 250 254 263    Basic Metabolic Panel: Recent Labs  Lab 07/16/17 1330 07/16/17 2120 07/17/17 0338 07/18/17 0441 07/19/17 0414  NA 137  --  134* 136 134*  K 4.4  --  3.9 4.2 4.0  CL 100*  --  102 102 99*  CO2 26  --  25 26 27   GLUCOSE 91  --  94 101* 93  BUN 6  --  12 10 12   CREATININE 1.03 1.05 1.03 1.07 1.05  CALCIUM 9.5  --  8.8* 9.2 9.0  MG  --  2.1  --  2.2 2.2  PHOS  --   --   --  3.6  --    Liver Function Tests: Recent Labs  Lab 07/16/17 1330 07/16/17 2120 07/18/17 0441  AST 21 20 16   ALT 16* 14* 13*  ALKPHOS 59 52 51  BILITOT 0.9 1.0 1.1  PROT 7.8 6.7 7.0  ALBUMIN 4.7 3.9 4.0   Cardiac Enzymes: Recent Labs  Lab 07/16/17 2120 07/17/17 0338 07/17/17 1030  TROPONINI <0.03 <0.03 <0.03    Hgb A1c Recent Labs    07/18/17 2041  HGBA1C 5.0   Lipid Profile Recent Labs    07/18/17 2041  CHOL 178  HDL 41  LDLCALC 105*  TRIG 160*  CHOLHDL 4.3   Microbiology No results found for this or any previous visit (from the past 240 hour(s)).  Inpatient Medications:   Scheduled Meds: . feeding supplement (ENSURE ENLIVE)  237 mL Oral BID BM  . gabapentin  300 mg Oral TID  . lisinopril  2.5 mg Oral Daily  . metoprolol tartrate  12.5 mg Oral BID  . nicotine  21  mg Transdermal Daily  . thiamine  100 mg Oral Daily  . traZODone  50 mg Oral QHS,MR X 1   Radiological Exams on Admission: No results found.  Impression/Recommendations  Principal Problem:   MDD (major depressive disorder), severe (HCC), polysubstance use  - per primary team - discussed cessation of tobacco and alcohol, cocaine - pt has verbalized understanding     Chest pain  - unclear etiology, pt has history of multi substance abuse and some of the issue could be related to coronary spasms - pt also with recent ECHO notable for EF 40-45%, work up appears to have been completed, CE x 2 negative - ECG unchanged - physical exam unremarkable - also while I  was examining pt, he had several short episodes of the chest pain and during that time, VS were stable - suspect component of anxiety as well  - I asked RN to give pt dose of Tramadol to see if that will help - will reassess in AM and if pt still with CP, may need transfer to [facility] for further work up  - will need to call cardiology office in AM to see if the follow up appointment has been arranged for pt   Thank you for this consultation.  Our [facility] hospitalist team will follow the patient with you.  Time Spent: 60 minutes   Sherlon Handing Jared Robinson M.D. [facility] 07/21/2017, 2:19 PM  [phone]

## 2017-07-21 NOTE — [facility] Group Notes (Signed)
[facility] LCSW Group Therapy Note  Date/Time:  07/21/2017 9:00-10:00AM  Type of Therapy and Topic:  Group Therapy:  Healthy and Unhealthy Supports  Participation Level:  Active   Description of Group:  Patients in this group were introduced to the idea of adding a variety of healthy supports to address the various needs in their lives. The picture on the front of Sunday's workbook was used to demonstrate why more supports are needed in every patient's life.  Patients identified and described healthy supports versus unhealthy supports in general, then gave examples of each in their own lives.   They discussed what additional healthy supports could be helpful in their recovery and wellness after discharge in order to prevent future hospitalizations.   An emphasis was placed on using counselor, doctor, therapy groups, 12-step groups, and problem-specific support groups to expand supports.  They also worked as a group on developing a specific plan for several patients to deal with unhealthy supports through boundary-setting, psychoeducation with loved ones, and even termination of relationships.   Therapeutic Goals:   1)  discuss importance of adding supports to stay well once out of the hospital  2)  compare healthy versus unhealthy supports and identify some examples of each  3)  generate ideas and descriptions of healthy supports that can be added  4)  offer mutual support about how to address unhealthy supports  5)  encourage active participation in and adherence to discharge plan    Summary of Patient Progress: The patient expressed a willingness to add 12-step groups and a sponsor in 2019 to help in his recovery journey.  He stated that he needs to figure out how to deal with getting his drug dealers out of his life.  He was one of the few people in the room who was able to acknowledge that willpower alone is not enough to remain well.  He talked about having been in the hospital previously and  thinking he would never return.   Therapeutic Modalities:   Motivational Interviewing Brief Solution-Focused Therapy  Ambrose Mantle, LCSW

## 2017-07-22 DIAGNOSIS — F322 Major depressive disorder, single episode, severe without psychotic features: Principal | ICD-10-CM

## 2017-07-22 DIAGNOSIS — F1010 Alcohol abuse, uncomplicated: Secondary | ICD-10-CM

## 2017-07-22 MED ORDER — VENLAFAXINE HCL ER 37.5 MG PO CP24
37.5000 mg | ORAL_CAPSULE | Freq: Every day | ORAL | Status: DC
  Administered 2017-07-22 – 2017-07-23 (×2): 37.5 mg via ORAL
  Filled 2017-07-22 (×4): qty 1

## 2017-07-22 MED ORDER — MIRTAZAPINE 7.5 MG PO TABS
7.5000 mg | ORAL_TABLET | Freq: Every day | ORAL | Status: DC
  Administered 2017-07-22 – 2017-07-24 (×3): 7.5 mg via ORAL
  Filled 2017-07-22 (×5): qty 1

## 2017-07-22 NOTE — Progress Notes (Signed)
Patient denies any chest pain, sob or palpitations  today .  Please re consult [facility] if he has any re occurrences of chest pain.  Will Sign off for now.    Kathlen Mody, MD [phone]

## 2017-07-22 NOTE — Plan of Care (Signed)
  Progressing Medication: Compliance with prescribed medication regimen will improve 07/22/2017 1144 - Progressing by Lenord Fellers, RN

## 2017-07-22 NOTE — Progress Notes (Signed)
Recreation Therapy Notes  Date: 07/22/17 Time: 0930 Location: 300 [address] Dayroom  Group Topic: Stress Management  Goal Area(s) Addresses:  Patient will verbalize importance of using healthy stress management.  Patient will identify positive emotions associated with healthy stress management.   Behavioral Response: Engaged  Intervention: Stress Management  Activity :  LRT introduced the stress management technique of meditation.  LRT played a meditation to help patients deal with anxiety.  Patients were to follow along to fully engage in the meditation.  Education:  Stress Management, Discharge Planning.   Education Outcome: Acknowledges edcuation/In group clarification offered/Needs additional education  Clinical Observations/Feedback: Pt attended group.    Caroll Rancher, LRT/CTRS         Lillia Abed, Finnegan Gatta A 07/22/2017 11:02 AM

## 2017-07-22 NOTE — Progress Notes (Signed)
Patient ID: Jared Robinson, male   DOB: 06-18-1971, 47 y.o.   MRN: [id number]  DAR: Pt. Denies SI/HI and A/V Hallucinations. He reports that his sleep last night was fair, his appetite is good, his energy level is normal, and his concentration is good. He rates his depression level 5/10, his hopelessness level is 5/10, and anxiety level is 10/10. Patient does not report any pain at this time. He denies any chest pain or discomfort. He reports cravings, agitation, irritability, and anxiety in relation to his withdrawal symptoms. PRN Ativan was administered to patient based on his CIWA score this morning. Support and encouragement provided to the patient. Scheduled medications administered patient per physician's orders. Patient is seen in the milieu and is attending groups. Q15 minute checks are maintained for safety.

## 2017-07-22 NOTE — [facility] Group Notes (Signed)
[facility] [employer] Group Therapy 07/22/2017 1:15pm  Type of Therapy: Mental Health Association Presentation  Participation Level: Active  Participation Quality: Attentive  Affect: Appropriate  Cognitive: Oriented  Insight: Developing/Improving  Engagement in Therapy: Engaged  Modes of Intervention: Discussion, Education and Socialization  Summary of Progress/Problems: Mental Health Association (MHA) Speaker came to talk about his personal journey with mental health. The pt processed ways by which to relate to the speaker. MHA speaker provided handouts and educational information pertaining to groups and services offered by the [facility]. Pt was engaged in speaker's presentation and was receptive to resources provided.    [employer], LCSW 07/22/2017 1:39 PM

## 2017-07-22 NOTE — Progress Notes (Signed)
Pt reported that second Ativan dose and one time Lopressor dose relieved his chest discomfort.  Pt then requested second Trazodone and went to bed.  Will continue to monitor.

## 2017-07-22 NOTE — Progress Notes (Signed)
[facility] MD Progress Note  07/22/2017 2:18 PM Jared Robinson  MRN:  [id number]   Subjective:  Patient reports that he is still feeling very depressed and his anxiety is very high. He reports shaking and just this uneasy feeling inside "it's like I'm nervous and depressed at the same time." He reports that he has used some kind of substance for the last 20 years and that he isn't sure what is related to drugs and alcohol and what isn't. He does feel he has a lot of anxiety and depression and would like something for it. He reports that he has been eating good though. He went to the gym today and played 2 games of basketball and had no chest pain and actually felt good. "My chest pain is probably my anxiety."  He also reports difficulty sleeping in general. He denies any SI/HI/AVH and contracts for safety.   Objective: Patient's chart and findings reviewed and discussed with treatment team. Patient presents pleasant and cooperative and he is in the day room interacting appropriately. He does appear to be shaking some. Discussed medications and will start Remeron 7.5 mg QHS and Effexor-XR 37.5 mg Daily.  Principal Problem: MDD (major depressive disorder), severe (HCC) Diagnosis:   Patient Active Problem List   Diagnosis Date Noted  . MDD (major depressive disorder), severe (HCC) [F32.2] 07/19/2017  . Adjustment disorder with mixed disturbance of emotions and conduct [F43.25]   . Polysubstance abuse (HCC) [F19.10] 07/16/2017  . Suicidal behavior [R46.89] 07/16/2017   Total Time spent with patient: 25 minutes  Past Psychiatric History: See H&P  Past Medical History:  Past Medical History:  Diagnosis Date  . Anxiety   . Chronic bronchitis (HCC)   . Chronic lower back pain   . COPD (chronic obstructive pulmonary disease) (HCC)   . History of blood transfusion 1984   related to "tonsillectomy" (07/17/2017)  . IBS (irritable bowel syndrome)     Past Surgical History:  Procedure Laterality Date  .  EYE SURGERY Right    "fell out of crib when I was a baby; busted it open"  . HAND SURGERY Left 2000   "degloving injury; vein graft, skin graft"  . HERNIA REPAIR    . INGUINAL HERNIA REPAIR Left   . LUMBAR LAMINECTOMY    . TONSILLECTOMY  1984  . UMBILICAL HERNIA REPAIR     Family History:  Family History  Problem Relation Age of Onset  . Hypertension Other    Family Psychiatric  History: See H&P Social History:  Social History   Substance and Sexual Activity  Alcohol Use Yes  . Alcohol/week: 50.4 oz  . Types: 84 Cans of beer per week   Comment: 07/17/2017 "12 beers or more day "     Social History   Substance and Sexual Activity  Drug Use Yes  . Types: Cocaine, Marijuana    Social History   Socioeconomic History  . Marital status: Married    Spouse name: None  . Number of children: None  . Years of education: None  . Highest education level: None  Social Needs  . Financial resource strain: None  . Food insecurity - worry: None  . Food insecurity - inability: None  . Transportation needs - medical: None  . Transportation needs - non-medical: None  Occupational History  . None  Tobacco Use  . Smoking status: Current Every Day Smoker    Packs/day: 1.00    Years: 29.00    Pack years: 29.00  Types: Cigarettes  . Smokeless tobacco: Never Used  Substance and Sexual Activity  . Alcohol use: Yes    Alcohol/week: 50.4 oz    Types: 84 Cans of beer per week    Comment: 07/17/2017 "12 beers or more day "  . Drug use: Yes    Types: Cocaine, Marijuana  . Sexual activity: Not Currently  Other Topics Concern  . None  Social History Narrative  . None   Additional Social History:                         Sleep: Fair  Appetite:  Good  Current Medications: Current Facility-Administered Medications  Medication Dose Route Frequency Provider Last Rate Last Dose  . alum & mag hydroxide-simeth (MAALOX/MYLANTA) 200-200-20 MG/5ML suspension 30 mL  30 mL Oral  Q6H PRN Izediuno, Delight Ovens, MD   30 mL at 07/21/17 0825  . feeding supplement (ENSURE ENLIVE) (ENSURE ENLIVE) liquid 237 mL  237 mL Oral BID BM Rankin, Shuvon B, NP   237 mL at 07/22/17 0814  . gabapentin (NEURONTIN) capsule 300 mg  300 mg Oral TID Rankin, Shuvon B, NP   300 mg at 07/22/17 1205  . hydrOXYzine (ATARAX/VISTARIL) tablet 25 mg  25 mg Oral Q6H PRN Nira Conn A, NP   25 mg at 07/21/17 2126  . lisinopril (PRINIVIL,ZESTRIL) tablet 2.5 mg  2.5 mg Oral Daily Rankin, Shuvon B, NP   2.5 mg at 07/22/17 0807  . loperamide (IMODIUM) capsule 2-4 mg  2-4 mg Oral PRN Nira Conn A, NP      . LORazepam (ATIVAN) tablet 1 mg  1 mg Oral Q6H PRN Nira Conn A, NP   1 mg at 07/22/17 7902  . metoprolol tartrate (LOPRESSOR) tablet 12.5 mg  12.5 mg Oral BID Rankin, Shuvon B, NP   12.5 mg at 07/22/17 0807  . mirtazapine (REMERON) tablet 7.5 mg  7.5 mg Oral QHS Husna Krone B, FNP      . nicotine (NICODERM CQ - dosed in mg/24 hours) patch 21 mg  21 mg Transdermal Daily Rankin, Shuvon B, NP   21 mg at 07/22/17 0809  . nitroGLYCERIN (NITROSTAT) SL tablet 0.4 mg  0.4 mg Sublingual Q5 min PRN Oneta Rack, NP   0.4 mg at 07/21/17 2126  . ondansetron (ZOFRAN-ODT) disintegrating tablet 4 mg  4 mg Oral Q6H PRN Nira Conn A, NP      . thiamine (VITAMIN B-1) tablet 100 mg  100 mg Oral Daily Rankin, Shuvon B, NP   100 mg at 07/22/17 0807  . traMADol (ULTRAM) tablet 50 mg  50 mg Oral Q6H PRN Dorothea Ogle, MD   50 mg at 07/21/17 2004  . traZODone (DESYREL) tablet 50 mg  50 mg Oral QHS,MR X 1 Nira Conn A, NP   50 mg at 07/21/17 2240  . venlafaxine XR (EFFEXOR-XR) 24 hr capsule 37.5 mg  37.5 mg Oral Daily Arzella Rehmann, Gerlene Burdock, FNP        Lab Results: No results found for this or any previous visit (from the past 48 hour(s)).  Blood Alcohol level:  Lab Results  Component Value Date   ETH <10 07/16/2017   ETH  02/20/2010    5        LOWEST DETECTABLE LIMIT FOR SERUM ALCOHOL IS 5 mg/dL FOR MEDICAL PURPOSES  ONLY    Metabolic Disorder Labs: Lab Results  Component Value Date   HGBA1C 5.0 07/18/2017  MPG 96.8 07/18/2017   No results found for: PROLACTIN Lab Results  Component Value Date   CHOL 178 07/18/2017   TRIG 160 (H) 07/18/2017   HDL 41 07/18/2017   CHOLHDL 4.3 07/18/2017   VLDL 32 07/18/2017   LDLCALC 105 (H) 07/18/2017    Physical Findings: AIMS: Facial and Oral Movements Muscles of Facial Expression: None, normal Lips and Perioral Area: None, normal Jaw: None, normal Tongue: None, normal,Extremity Movements Upper (arms, wrists, hands, fingers): None, normal Lower (legs, knees, ankles, toes): None, normal, Trunk Movements Neck, shoulders, hips: None, normal, Overall Severity Severity of abnormal movements (highest score from questions above): None, normal Incapacitation due to abnormal movements: None, normal Patient's awareness of abnormal movements (rate only patient's report): No Awareness, Dental Status Current problems with teeth and/or dentures?: No Does patient usually wear dentures?: No  CIWA:  CIWA-Ar Total: 11 COWS:  COWS Total Score: 5  Musculoskeletal: Strength & Muscle Tone: within normal limits Gait & Station: normal Patient leans: N/A  Psychiatric Specialty Exam: Physical Exam  Nursing note and vitals reviewed. Constitutional: He is oriented to person, place, and time. He appears well-developed and well-nourished.  Cardiovascular: Normal rate.  Respiratory: Effort normal.  Musculoskeletal: Normal range of motion.  Neurological: He is alert and oriented to person, place, and time.  Skin: Skin is warm.    Review of Systems  Constitutional: Negative.   HENT: Negative.   Eyes: Negative.   Respiratory: Negative.   Cardiovascular: Negative.   Gastrointestinal: Negative.   Genitourinary: Negative.   Musculoskeletal: Negative.   Skin: Negative.   Neurological: Negative.   Endo/Heme/Allergies: Negative.   Psychiatric/Behavioral: Positive for  depression and substance abuse. Negative for hallucinations and suicidal ideas. The patient is nervous/anxious.     Blood pressure 123/81, pulse 92, temperature 98.4 F (36.9 C), temperature source Oral, resp. rate 16, height 5\' 10"  (1.778 m), weight 73 kg (161 lb), SpO2 100 %.Body mass index is 23.1 kg/m.  General Appearance: Casual  Eye Contact:  Good  Speech:  Clear and Coherent and Normal Rate  Volume:  Normal  Mood:  Depressed  Affect:  Depressed and Flat  Thought Process:  Goal Directed and Descriptions of Associations: Intact  Orientation:  Full (Time, Place, and Person)  Thought Content:  WDL  Suicidal Thoughts:  No  Homicidal Thoughts:  No  Memory:  Immediate;   Good Recent;   Good Remote;   Good  Judgement:  Good  Insight:  Good  Psychomotor Activity:  Normal  Concentration:  Concentration: Good and Attention Span: Good  Recall:  Good  Fund of Knowledge:  Good  Language:  Good  Akathisia:  No  Handed:  Right  AIMS (if indicated):     Assets:  Communication Skills Desire for Improvement Financial Resources/Insurance Housing Physical Health Social Support Transportation  ADL's:  Intact  Cognition:  WNL  Sleep:  Number of Hours: 5   Problems Addressed: Polysubstance abuse MDD severe  Treatment Plan Summary: Daily contact with patient to assess and evaluate symptoms and progress in treatment, Medication management and Plan is to:  -Start Effexor-XR 37.5 mg PO Daily for mood stability -Start Remeron 7.5 mg PO QHS for mood stability -Continue Gabapentin 300 mg PO TID for withdrawal symptoms -Continue Ativan CIWA Q6H PRN -Encourage group therapy participation  Maryfrances Bunnell, FNP 07/22/2017, 2:18 PM

## 2017-07-22 NOTE — Progress Notes (Signed)
Patient ID: Jared Robinson, male   DOB: 1970-12-14, 47 y.o.   MRN: [id number]  D: Patient pleasant to undersigned but arguing with someone on the phone tonight. Reports anxiety and feeling shaky and agitated. Telling undersigned about when he was having chest pain yesterday. Wanting BP to be checked even though it has been in normal range. When asking about suicidal ideations, he was very vague and stating that he was trying all day not to think about SI. Passive SI from time to time. No active SI. Reported having some sleep issues in past night. Remeron given with trazodone tonight. A: Staff will monitor on q 15 minute checks, follow treatment plan, and give meds as ordered. R: Cooperative on the unit.

## 2017-07-22 NOTE — Tx Team (Signed)
Interdisciplinary Treatment and Diagnostic Plan Update  07/22/2017 Time of Session: 0830AM Jared Robinson MRN: [id number]  Principal Diagnosis: MDD (major depressive disorder), severe (HCC)  Secondary Diagnoses: Principal Problem:   MDD (major depressive disorder), severe (HCC)   Current Medications:  Current Facility-Administered Medications  Medication Dose Route Frequency Provider Last Rate Last Dose  . alum & mag hydroxide-simeth (MAALOX/MYLANTA) 200-200-20 MG/5ML suspension 30 mL  30 mL Oral Q6H PRN Izediuno, Delight Ovens, MD   30 mL at 07/21/17 0825  . feeding supplement (ENSURE ENLIVE) (ENSURE ENLIVE) liquid 237 mL  237 mL Oral BID BM Rankin, Shuvon B, NP   237 mL at 07/22/17 0814  . gabapentin (NEURONTIN) capsule 300 mg  300 mg Oral TID Rankin, Shuvon B, NP   300 mg at 07/22/17 0807  . hydrOXYzine (ATARAX/VISTARIL) tablet 25 mg  25 mg Oral Q6H PRN Nira Conn A, NP   25 mg at 07/21/17 2126  . lisinopril (PRINIVIL,ZESTRIL) tablet 2.5 mg  2.5 mg Oral Daily Rankin, Shuvon B, NP   2.5 mg at 07/22/17 0807  . loperamide (IMODIUM) capsule 2-4 mg  2-4 mg Oral PRN Nira Conn A, NP      . LORazepam (ATIVAN) tablet 1 mg  1 mg Oral Q6H PRN Nira Conn A, NP   1 mg at 07/22/17 4098  . metoprolol tartrate (LOPRESSOR) tablet 12.5 mg  12.5 mg Oral BID Rankin, Shuvon B, NP   12.5 mg at 07/22/17 0807  . nicotine (NICODERM CQ - dosed in mg/24 hours) patch 21 mg  21 mg Transdermal Daily Rankin, Shuvon B, NP   21 mg at 07/22/17 0809  . nitroGLYCERIN (NITROSTAT) SL tablet 0.4 mg  0.4 mg Sublingual Q5 min PRN Oneta Rack, NP   0.4 mg at 07/21/17 2126  . ondansetron (ZOFRAN-ODT) disintegrating tablet 4 mg  4 mg Oral Q6H PRN Nira Conn A, NP      . thiamine (VITAMIN B-1) tablet 100 mg  100 mg Oral Daily Rankin, Shuvon B, NP   100 mg at 07/22/17 0807  . traMADol (ULTRAM) tablet 50 mg  50 mg Oral Q6H PRN Dorothea Ogle, MD   50 mg at 07/21/17 2004  . traZODone (DESYREL) tablet 50 mg  50 mg Oral  QHS,MR X 1 Nira Conn A, NP   50 mg at 07/21/17 2240   PTA Medications: Medications Prior to Admission  Medication Sig Dispense Refill Last Dose  . acetaminophen (TYLENOL) 325 MG tablet Take 2 tablets (650 mg total) by mouth every 6 (six) hours as needed for mild pain (or Fever >/= 101).     . folic acid (FOLVITE) 1 MG tablet Take 1 tablet (1 mg total) by mouth daily.     Marland Kitchen gabapentin (NEURONTIN) 600 MG tablet Take 0.5 tablets (300 mg total) by mouth 3 (three) times daily.     Marland Kitchen lisinopril (PRINIVIL,ZESTRIL) 2.5 MG tablet Take 1 tablet (2.5 mg total) by mouth daily.     . metoprolol tartrate (LOPRESSOR) 25 MG tablet Take 0.5 tablets (12.5 mg total) by mouth 2 (two) times daily.     . nicotine (NICODERM CQ - DOSED IN MG/24 HOURS) 21 mg/24hr patch Place 1 patch (21 mg total) onto the skin daily. 28 patch 0   . thiamine 100 MG tablet Take 1 tablet (100 mg total) by mouth daily.       Patient Stressors: Health problems Marital or family conflict Substance abuse  Patient Strengths: Capable of independent living General fund  of knowledge  Treatment Modalities: Medication Management, Group therapy, Case management,  1 to 1 session with clinician, Psychoeducation, Recreational therapy.   Physician Treatment Plan for Primary Diagnosis: MDD (major depressive disorder), severe (HCC) Long Term Goal(s): Improvement in symptoms so as ready for discharge Improvement in symptoms so as ready for discharge   Short Term Goals: Ability to identify changes in lifestyle to reduce recurrence of condition will improve Ability to verbalize feelings will improve Ability to disclose and discuss suicidal ideas Ability to disclose and discuss suicidal ideas Ability to demonstrate self-control will improve Ability to identify and develop effective coping behaviors will improve Compliance with prescribed medications will improve Ability to identify triggers associated with substance abuse/mental health issues  will improve  Medication Management: Evaluate patient's response, side effects, and tolerance of medication regimen.  Therapeutic Interventions: 1 to 1 sessions, Unit Group sessions and Medication administration.  Evaluation of Outcomes: Progressing  Physician Treatment Plan for Secondary Diagnosis: Principal Problem:   MDD (major depressive disorder), severe (HCC)  Long Term Goal(s): Improvement in symptoms so as ready for discharge Improvement in symptoms so as ready for discharge   Short Term Goals: Ability to identify changes in lifestyle to reduce recurrence of condition will improve Ability to verbalize feelings will improve Ability to disclose and discuss suicidal ideas Ability to disclose and discuss suicidal ideas Ability to demonstrate self-control will improve Ability to identify and develop effective coping behaviors will improve Compliance with prescribed medications will improve Ability to identify triggers associated with substance abuse/mental health issues will improve     Medication Management: Evaluate patient's response, side effects, and tolerance of medication regimen.  Therapeutic Interventions: 1 to 1 sessions, Unit Group sessions and Medication administration.  Evaluation of Outcomes: Progressing   RN Treatment Plan for Primary Diagnosis: MDD (major depressive disorder), severe (HCC) Long Term Goal(s): Knowledge of disease and therapeutic regimen to maintain health will improve  Short Term Goals: Ability to remain free from injury will improve, Ability to disclose and discuss suicidal ideas and Ability to identify and develop effective coping behaviors will improve  Medication Management: RN will administer medications as ordered by provider, will assess and evaluate patient's response and provide education to patient for prescribed medication. RN will report any adverse and/or side effects to prescribing provider.  Therapeutic Interventions: 1 on 1  counseling sessions, Psychoeducation, Medication administration, Evaluate responses to treatment, Monitor vital signs and CBGs as ordered, Perform/monitor CIWA, COWS, AIMS and Fall Risk screenings as ordered, Perform wound care treatments as ordered.  Evaluation of Outcomes: Progressing   LCSW Treatment Plan for Primary Diagnosis: MDD (major depressive disorder), severe (HCC) Long Term Goal(s): Safe transition to appropriate next level of care at discharge, Engage patient in therapeutic group addressing interpersonal concerns.  Short Term Goals: Engage patient in aftercare planning with referrals and resources, Facilitate patient progression through stages of change regarding substance use diagnoses and concerns and Identify triggers associated with mental health/substance abuse issues  Therapeutic Interventions: Assess for all discharge needs, 1 to 1 time with Social worker, Explore available resources and support systems, Assess for adequacy in community support network, Educate family and significant other(s) on suicide prevention, Complete Psychosocial Assessment, Interpersonal group therapy.  Evaluation of Outcomes: Progressing   Progress in Treatment: Attending groups: Yes. Participating in groups: Yes. Taking medication as prescribed: Yes. Toleration medication: Yes. Family/Significant other contact made: Yes, individual(s) contacted:  SPE completed with pt's wife. SPI pamphlet and Mobile Crisis information provided to pt.  Patient  understands diagnosis: Yes. Discussing patient identified problems/goals with staff: Yes. Medical problems stabilized or resolved: Yes. Denies suicidal/homicidal ideation: Yes. Issues/concerns per patient self-inventory: No. Other: n/a   New problem(s) identified: No, Describe:  n/a  New Short Term/Long Term Goal(s): medication management for detox/mood stabilization; elimination of SI thoughts; development of comprehensive mental wellness/sobriety  plan.   Discharge Plan or Barriers: CSW assessing. Pt has private insurance-his wife is interested in an all male rehab due to pt's sex addiction. Pt unsure which direction he would like to go regarding aftercare. CSW continuing to assess.   Reason for Continuation of Hospitalization: Anxiety Depression Medication stabilization Withdrawal symptoms Other; describe sex addiction issues  Estimated Length of Stay: Wed, 07/24/17  Attendees: Patient: 07/22/2017 8:57 AM  Physician: Dr. Jackquline Berlin MD; Dr. Altamese [place] MD 07/22/2017 8:57 AM  Nursing: Earl Lagos RN 07/22/2017 8:57 AM  RN Care Manager:x 07/22/2017 8:57 AM  Social Worker: Chartered loss adjuster, LCSW 07/22/2017 8:57 AM  Recreational Therapist: x 07/22/2017 8:57 AM  Other: Hillery Jacks NP; Feliz Beam Money NP 07/22/2017 8:57 AM  Other:  07/22/2017 8:57 AM  Other: 07/22/2017 8:57 AM    Scribe for Treatment Team: Ledell Peoples Smart, LCSW 07/22/2017 8:57 AM

## 2017-07-23 DIAGNOSIS — R45851 Suicidal ideations: Secondary | ICD-10-CM

## 2017-07-23 MED ORDER — TRAZODONE HCL 50 MG PO TABS
50.0000 mg | ORAL_TABLET | Freq: Every evening | ORAL | Status: DC | PRN
  Administered 2017-07-24: 50 mg via ORAL
  Filled 2017-07-23: qty 1

## 2017-07-23 MED ORDER — TRAMADOL HCL 50 MG PO TABS: 50.0000 mg | ORAL_TABLET | Freq: Two times a day (BID) | ORAL | Status: DC | PRN

## 2017-07-23 MED ORDER — MAGNESIUM HYDROXIDE 400 MG/5ML PO SUSP
30.0000 mL | Freq: Every day | ORAL | Status: DC | PRN
  Administered 2017-07-23: 30 mL via ORAL
  Filled 2017-07-23: qty 30

## 2017-07-23 MED ORDER — GABAPENTIN 400 MG PO CAPS
400.0000 mg | ORAL_CAPSULE | Freq: Three times a day (TID) | ORAL | Status: DC
  Administered 2017-07-24 – 2017-07-25 (×5): 400 mg via ORAL
  Filled 2017-07-23 (×11): qty 1

## 2017-07-23 MED ORDER — HYDROXYZINE HCL 50 MG PO TABS
50.0000 mg | ORAL_TABLET | Freq: Four times a day (QID) | ORAL | Status: DC | PRN
  Administered 2017-07-23: 50 mg via ORAL
  Filled 2017-07-23: qty 1

## 2017-07-23 MED ORDER — LORAZEPAM 0.5 MG PO TABS
0.5000 mg | ORAL_TABLET | Freq: Four times a day (QID) | ORAL | Status: DC | PRN
  Administered 2017-07-23: 0.5 mg via ORAL
  Filled 2017-07-23: qty 1

## 2017-07-23 MED ORDER — SERTRALINE HCL 25 MG PO TABS
25.0000 mg | ORAL_TABLET | Freq: Every day | ORAL | Status: DC
  Administered 2017-07-23 – 2017-07-25 (×3): 25 mg via ORAL
  Filled 2017-07-23 (×6): qty 1

## 2017-07-23 NOTE — Progress Notes (Signed)
CSW left message for Thayer Ohm Architect) at [facility] [phone] per patient request. This is an all male recovery facility. It may require out of pocket pay only. CSW left message inquiring for patient. Pt states that he cannot go to [facility] per his wife and is thinking that [facility] [place] may be his second choice. CSW continuing to assess. Pt continues to demonstrate withdrawal symptoms including shakiness, anxiety, and some agitation.   Trula Slade, MSW, LCSW Clinical Social Worker 07/23/2017 11:33 AM

## 2017-07-23 NOTE — Progress Notes (Signed)
D: Patient observed up and visible in the milieu. Patient states he is agitated and anxious. "I can feel it in my chest. That's been happening. It's just how my anxiety manifests itself." Patient's affect flat anxious with agitated, irritable and anxious mood. Per self inventory and discussions with writer, rates depression at a 5/10, hopelessness at a 5/10 and anxiety at a 10/10. Rates sleep as good, appetite as good, energy as normal and concentration as good.  States goal for today is "wellness." Denies pain, physical complaints other than anxiety related chest discomfort.   A: Medicated per orders, prn vistaril given for anxiety along with med education. Level III obs in place for safety. Emotional support offered and self inventory reviewed. Encouraged completion of Suicide Safety Plan and programming participation. Discussed POC with MD, SW.     R: Patient verbalizes understanding of POC. On reassess, patient is calmer. Patient denies SI/HI/AVH and remains safe on level III obs. Will continue to monitor closely and make verbal contact frequently.

## 2017-07-23 NOTE — [facility] Group Notes (Signed)
LCSW Group Therapy Note  07/23/2017 1:15pm  Type of Therapy/Topic:  Group Therapy:  Feelings about Diagnosis  Participation Level:  Active   Description of Group:   This group will allow patients to explore their thoughts and feelings about diagnoses they have received. Patients will be guided to explore their level of understanding and acceptance of these diagnoses. Facilitator will encourage patients to process their thoughts and feelings about the reactions of others to their diagnosis and will guide patients in identifying ways to discuss their diagnosis with significant others in their lives. This group will be process-oriented, with patients participating in exploration of their own experiences, giving and receiving support, and processing challenge from other group members.   Therapeutic Goals: 1. Patient will demonstrate understanding of diagnosis as evidenced by identifying two or more symptoms of the disorder 2. Patient will be able to express two feelings regarding the diagnosis 3. Patient will demonstrate their ability to communicate their needs through discussion and/or role play  Summary of Patient Progress:  Tiernan was attentive and engaged during today's processing group. He shared that he did not know his official diagnosis but reports that he asked his RN for a printout of the medications he is prescribed "to learn about them and why I have to take them." He reports that he feels powerless over his diagnosis and does not like the AA model because it tends to stress the powerlessness piece. "That doesn't help me. I want to feel knowledgeable and more in control of my choices and my illness." Nilton continues to show progress in the group setting with improving insight and is hoping to get into "An all male rehab."    Therapeutic Modalities:   Cognitive Behavioral Therapy Brief Therapy Feelings Identification    Ledell Peoples Smart, LCSW 07/23/2017 3:08 PM

## 2017-07-23 NOTE — Plan of Care (Signed)
Patient verbalizes understanding of information, education provided.  Patient has remained free from self harm, denies thoughts to do so.

## 2017-07-23 NOTE — Progress Notes (Addendum)
[facility] MD Progress Note  07/23/2017 5:19 PM Jared Robinson  MRN:  [id number]   Subjective:  Patient reports feeling severely anxious. Denies suicidal ideations. Denies any clear medication side effects but states he has been feeling more anxious today and questions whether it may be medication side effect ( recently started on Remeron, Effexor XR ) . He reports he has chronic anxiety.   Objective: I have discussed case with treatment team and have met with patient . 47 year old male, history of polysubstance abuse ( admission UDS positive for cocaine and for cannabis- admission BAL negative) who initially presented for pain, reporting also depression and suicidal ideations. Currently reports improving mood but describes significant anxiety. States anxiety is chronic but feels it is more significant today. Denies current SI, and is future oriented- is considering a recovery residential program as disposition plan [facility]) .   He presents  anxious, subtly tremulous. Anxiety decreases partially during session with reassurance, support. ( Of note, he states he last drank / used illicit drugs on  16/01 , so that it is not felt current anxiety/ presentation is  related to withdrawal)  Denies any current chest pain , no shortness of breath at room air, no DNP or orthopnea. No peripheral edema.   We discussed medication options- states that in the past had been prescribed Zoloft and felt that it worked, stopped it due to concerns about sexual side effects, but states he would agree to  try this medication again .     Principal Problem: MDD (major depressive disorder), severe ([place]) Diagnosis:   Patient Active Problem List   Diagnosis Date Noted  . MDD (major depressive disorder), severe ([place]) [F32.2] 07/19/2017  . Adjustment disorder with mixed disturbance of emotions and conduct [F43.25]   . Polysubstance abuse ([place]) [F19.10] 07/16/2017  . Suicidal behavior [R46.89] 07/16/2017   Total Time spent  with patient: 20 minutes  Past Psychiatric History: See H&P  Past Medical History:  Past Medical History:  Diagnosis Date  . Anxiety   . Chronic bronchitis ([place])   . Chronic lower back pain   . COPD (chronic obstructive pulmonary disease) ([place])   . History of blood transfusion 1984   related to "tonsillectomy" (07/17/2017)  . IBS (irritable bowel syndrome)     Past Surgical History:  Procedure Laterality Date  . EYE SURGERY Right    "fell out of crib when I was a baby; busted it open"  . HAND SURGERY Left 2000   "degloving injury; vein graft, skin graft"  . HERNIA REPAIR    . INGUINAL HERNIA REPAIR Left   . LUMBAR LAMINECTOMY    . TONSILLECTOMY  1984  . UMBILICAL HERNIA REPAIR     Family History:  Family History  Problem Relation Age of Onset  . Hypertension Other    Family Psychiatric  History: See H&P Social History:  Social History   Substance and Sexual Activity  Alcohol Use Yes  . Alcohol/week: 50.4 oz  . Types: 84 Cans of beer per week   Comment: 07/17/2017 "12 beers or more day "     Social History   Substance and Sexual Activity  Drug Use Yes  . Types: Cocaine, Marijuana    Social History   Socioeconomic History  . Marital status: Married    Spouse name: None  . Number of children: None  . Years of education: None  . Highest education level: None  Social Needs  . Financial resource strain:  None  . Food insecurity - worry: None  . Food insecurity - inability: None  . Transportation needs - medical: None  . Transportation needs - non-medical: None  Occupational History  . None  Tobacco Use  . Smoking status: Current Every Day Smoker    Packs/day: 1.00    Years: 29.00    Pack years: 29.00    Types: Cigarettes  . Smokeless tobacco: Never Used  Substance and Sexual Activity  . Alcohol use: Yes    Alcohol/week: 50.4 oz    Types: 84 Cans of beer per week    Comment: 07/17/2017 "12 beers or more day "  . Drug use: Yes    Types: Cocaine, Marijuana   . Sexual activity: Not Currently  Other Topics Concern  . None  Social History Narrative  . None   Additional Social History:   Sleep: Good  Appetite:  Good  Current Medications: Current Facility-Administered Medications  Medication Dose Route Frequency Provider Last Rate Last Dose  . alum & mag hydroxide-simeth (MAALOX/MYLANTA) 200-200-20 MG/5ML suspension 30 mL  30 mL Oral Q6H PRN Izediuno, Laruth Bouchard, MD   30 mL at 07/21/17 0825  . feeding supplement (ENSURE ENLIVE) (ENSURE ENLIVE) liquid 237 mL  237 mL Oral BID BM Rankin, Shuvon B, NP   237 mL at 07/23/17 0908  . [START ON 07/24/2017] gabapentin (NEURONTIN) capsule 400 mg  400 mg Oral TID Cobos, Myer Peer, MD      . LORazepam (ATIVAN) tablet 0.5 mg  0.5 mg Oral Q6H PRN Cobos, Fernando A, MD      . magnesium hydroxide (MILK OF MAGNESIA) suspension 30 mL  30 mL Oral Daily PRN Money, Darnelle Maffucci B, FNP   30 mL at 07/23/17 1212  . metoprolol tartrate (LOPRESSOR) tablet 12.5 mg  12.5 mg Oral BID Rankin, Shuvon B, NP   12.5 mg at 07/23/17 0817  . mirtazapine (REMERON) tablet 7.5 mg  7.5 mg Oral QHS Money, Darnelle Maffucci B, FNP   7.5 mg at 07/22/17 2147  . nicotine (NICODERM CQ - dosed in mg/24 hours) patch 21 mg  21 mg Transdermal Daily Rankin, Shuvon B, NP   21 mg at 07/23/17 0908  . nitroGLYCERIN (NITROSTAT) SL tablet 0.4 mg  0.4 mg Sublingual Q5 min PRN Derrill Center, NP   0.4 mg at 07/21/17 2126  . sertraline (ZOLOFT) tablet 25 mg  25 mg Oral Daily Cobos, Fernando A, MD      . thiamine (VITAMIN B-1) tablet 100 mg  100 mg Oral Daily Rankin, Shuvon B, NP   100 mg at 07/23/17 0817  . traMADol (ULTRAM) tablet 50 mg  50 mg Oral Q12H PRN Cobos, Myer Peer, MD      . traZODone (DESYREL) tablet 50 mg  50 mg Oral QHS PRN Cobos, Myer Peer, MD        Lab Results: No results found for this or any previous visit (from the past 48 hour(s)).  Blood Alcohol level:  Lab Results  Component Value Date   ETH <10 07/16/2017   ETH  02/20/2010    5         LOWEST DETECTABLE LIMIT FOR SERUM ALCOHOL IS 5 mg/dL FOR MEDICAL PURPOSES ONLY    Metabolic Disorder Labs: Lab Results  Component Value Date   HGBA1C 5.0 07/18/2017   MPG 96.8 07/18/2017   No results found for: PROLACTIN Lab Results  Component Value Date   CHOL 178 07/18/2017   TRIG 160 (H) 07/18/2017  HDL 41 07/18/2017   CHOLHDL 4.3 07/18/2017   VLDL 32 07/18/2017   LDLCALC 105 (H) 07/18/2017    Physical Findings: AIMS: Facial and Oral Movements Muscles of Facial Expression: None, normal Lips and Perioral Area: None, normal Jaw: None, normal Tongue: None, normal,Extremity Movements Upper (arms, wrists, hands, fingers): None, normal Lower (legs, knees, ankles, toes): None, normal, Trunk Movements Neck, shoulders, hips: None, normal, Overall Severity Severity of abnormal movements (highest score from questions above): None, normal Incapacitation due to abnormal movements: None, normal Patient's awareness of abnormal movements (rate only patient's report): No Awareness, Dental Status Current problems with teeth and/or dentures?: No Does patient usually wear dentures?: No  CIWA:  CIWA-Ar Total: 4 COWS:  COWS Total Score: 5  Musculoskeletal: Strength & Muscle Tone: within normal limits Gait & Station: normal Patient leans: N/A  Psychiatric Specialty Exam: Physical Exam  Nursing note and vitals reviewed. Constitutional: He is oriented to person, place, and time. He appears well-developed and well-nourished.  Cardiovascular: Normal rate.  Respiratory: Effort normal.  Musculoskeletal: Normal range of motion.  Neurological: He is alert and oriented to person, place, and time.  Skin: Skin is warm.    Review of Systems  Constitutional: Negative.   HENT: Negative.   Eyes: Negative.   Respiratory: Negative.   Cardiovascular: Negative.   Gastrointestinal: Negative.   Genitourinary: Negative.   Musculoskeletal: Negative.   Skin: Negative.   Neurological: Negative.    Endo/Heme/Allergies: Negative.   Psychiatric/Behavioral: Positive for depression and substance abuse. Negative for hallucinations and suicidal ideas. The patient is nervous/anxious.   no chest pain, no shortness of breath, no vomiting   Blood pressure 118/82, pulse (!) 120, temperature (!) 97.3 F (36.3 C), temperature source Oral, resp. rate 16, height '5\' 10"'$  (1.778 m), weight 73 kg (161 lb), SpO2 100 %.Body mass index is 23.1 kg/m.  General Appearance: Fairly Groomed  Eye Contact:  Good  Speech:  Normal Rate  Volume:  Normal  Mood:  mood is improving, states he feels better   Affect:  anxious, tends to improve during session  Thought Process:  Linear and Descriptions of Associations: Intact  Orientation:  Other:  fully alert and attentive  Thought Content:  no hallucinations, no delusions   Suicidal Thoughts:  No- denies suicidal or self injurious ideations, denies homicidal or violent ideations, contracts for safety on unit   Homicidal Thoughts:  No  Memory:  Recent and remote grossly intact   Judgement:  Other:  improving   Insight:  improving   Psychomotor Activity:  Normal- subtle distal tremors   Concentration:  Concentration: Good and Attention Span: Good  Recall:  Good  Fund of Knowledge:  Good  Language:  Good  Akathisia:  No  Handed:  Right  AIMS (if indicated):     Assets:  Communication Skills Desire for Improvement Financial Resources/Insurance Housing Physical Health Social Support Transportation  ADL's:  Intact  Cognition:  WNL  Sleep:  Number of Hours: 6.5   Assessment - patient reports partially improved mood. At this time endorses anxiety as major symptom. States anxiety is chronic and describes self as " I have always been high strung " but states feeling more anxious today, and worries it may be related to new antidepressant trial ( Remeron , Effexor XR )  Based on above and on cardiac history will discontinue Effexor XR . States Zoloft was effective  during a past trial .   Treatment Plan Summary: Daily contact with patient to assess  and evaluate symptoms and progress in treatment, Medication management and Plan is to:  Treatment Plan reviewed as below today 1/8  -Encourage group participation to work on Radiographer, therapeutic and symptom reduction  -Encourage efforts to work on sobriety and relapse prevention   -D/C Effexor XR - see above  -Start Zoloft 25 mgrs QDAY for depression, anxiety, titrate gradually as tolerated  -Continue Remeron 7.5 mg PO QHS for depression, anxiety, and insomnia -Increase  Gabapentin to 400 mg PO TID for anxiety  -Start Ativan 0.5 mgrs Q 6 hours PRN for anxiety. -D/C Vistaril PRNs  -D/C Ultram  to decrease risk of drug interactions, serotonin syndrome   -Encourage group therapy participation  Jenne Campus, MD 07/23/2017, 5:19 PM   Patient ID: Jared Robinson, male   DOB: [birth year], 47 y.o.   MRN: [id number]

## 2017-07-23 NOTE — Progress Notes (Signed)
Recreation Therapy Notes  Animal-Assisted Activity (AAA) Program Checklist/Progress Notes Patient Eligibility Criteria Checklist & Daily Group note for Rec TxIntervention  Date: 01.18.2018 Time: 2:45pm Location: [address]   AAA/T Program Assumption of Risk Form signed by Patient/ or Parent Legal Guardian Yes  Patient is free of allergies or sever asthma  Yes  Patient reports no fear of animals Yes  Patient reports no history of cruelty to animals Yes   Patient understands his/her participation is voluntary Yes  Patient washes hands before animal contact Yes  Patient washes hands after animal contact Yes  Behavioral Response: Appropriate, Attentive   Education:Hand Washing, Appropriate Animal Interaction   Education Outcome: Acknowledges education.   Clinical Observations/Feedback: Patient attended session and interacted appropriately with therapy dog and peers.   Marykay Lex Derico Mitton, LRT/CTRS        Jearl Klinefelter 07/23/2017 2:58 PM

## 2017-07-23 NOTE — [facility] Suicide Risk Assessment (Signed)
[facility] INPATIENT:  Family/Significant Other Suicide Prevention Education  Suicide Prevention Education:  Education Completed; Jared Robinson (pt's wife) [phone] has been identified by the patient as the family member/significant other with whom the patient will be residing, and identified as the person(s) who will aid the patient in the event of a mental health crisis (suicidal ideations/suicide attempt).  With written consent from the patient, the family member/significant other has been provided the following suicide prevention education, prior to the and/or following the discharge of the patient.  The suicide prevention education provided includes the following:  Suicide risk factors  Suicide prevention and interventions  National Suicide Hotline telephone number  [facility] assessment telephone number  [facility] Emergency Assistance 911  [facility] and/or Residential Mobile Crisis Unit telephone number  Request made of family/significant other to:  Remove weapons (e.g., guns, rifles, knives), all items previously/currently identified as safety concern.    Remove drugs/medications (over-the-counter, prescriptions, illicit drugs), all items previously/currently identified as a safety concern.  The family member/significant other verbalizes understanding of the suicide prevention education information provided.  The family member/significant other agrees to remove the items of safety concern listed above.  SPE and aftercare options reviewed with pt's wife. She is adamant that she wants pt "relatively close to [place] and at a facility where it is only men. " Pt's wife shares that pt has been "destroying our family for years. This is his last chance to get things right." [facility] and [facility] [place] are possibly referral options. CSW continuing to assess with pt.   Euphemia Lingerfelt N Smart LCSW 07/23/2017, 12:38 PM

## 2017-07-23 NOTE — Progress Notes (Signed)
Pt attended NA group this evening.  

## 2017-07-24 MED ORDER — ACETAMINOPHEN 325 MG PO TABS
650.0000 mg | ORAL_TABLET | Freq: Four times a day (QID) | ORAL | Status: DC | PRN
  Administered 2017-07-24: 650 mg via ORAL
  Filled 2017-07-24: qty 2

## 2017-07-24 NOTE — Progress Notes (Signed)
CSW and pt met individually to discuss disposition/aftercare planning. Pt states that he did not get a good feeling from his interview yesterday with a men's program through [facility] [place] and is not interested in pursuing that option. CSW encouraged pt to allow [facility] [place] referral today in order to expand his very limited options, as his wife is only wanting him to attend men's only rehab. Pt is upset with his wife and states that he has "a lot of thinking to do about whether we should just end our marriage. She is too controlling and she is making it worse for me being in here." PT is motivated to pursue treatment but continues to focus on marital discord. CSW informed pt that he will likely discharge in the next few days and will have to make a decision as to what he wants to do and where he wants to go. Pt verbalized understanding and agreed to [facility] [place] referral. He asked that CSW not contact his wife or return her calls, as he is thinking about putting her on "the do not call list." Pt encouraged to stay out of his room and remain in dayroom for socialization and groups. He agreed and left room with CSW.   Maxie Better, MSW, LCSW Clinical Social Worker 07/24/2017 10:47 AM

## 2017-07-24 NOTE — Progress Notes (Signed)
The patient attended the evening N.A.meeting and was appropriate.  

## 2017-07-24 NOTE — Plan of Care (Signed)
Patient is working on Engineer, manufacturing. Patient was pleasant and cooperative with staff tonight and interacted with peers well.

## 2017-07-24 NOTE — Progress Notes (Signed)
[facility] MD Progress Note  07/24/2017 2:05 PM Jared Robinson  MRN:  [id number]   Subjective:  Patient reports that he is doing ok today, just having some anxiety from time to time. He denies any SI/HI/AVH and contracts for safety. He reports that he is having trouble deciding on where to go because e of his wife being so demansing and telling him he has to go to an all male facility. He reports that he is getting overwhelmed with all of it. He denies any medication side effects today   Objective: Patient's chart and findings reviewed and discussed with treatment team. Patient has been seen in the day room and in groups interacting appropriately. Patient is told to make a decision on a facility and that was his decision for his own treatment. He decided on calling [facility] which he reports accepted him yesterday. He is calling back for bed availability and details on medications.  Principal Problem: MDD (major depressive disorder), severe ([facility]) Diagnosis:   Patient Active Problem List   Diagnosis Date Noted  . MDD (major depressive disorder), severe ([facility]) [F32.2] 07/19/2017  . Adjustment disorder with mixed disturbance of emotions and conduct [F43.25]   . Polysubstance abuse ([facility]) [F19.10] 07/16/2017  . Suicidal behavior [R46.89] 07/16/2017   Total Time spent with patient: 15 minutes  Past Psychiatric History: See H&P  Past Medical History:  Past Medical History:  Diagnosis Date  . Anxiety   . Chronic bronchitis ([facility])   . Chronic lower back pain   . COPD (chronic obstructive pulmonary disease) ([facility])   . History of blood transfusion 1984   related to "tonsillectomy" (07/17/2017)  . IBS (irritable bowel syndrome)     Past Surgical History:  Procedure Laterality Date  . EYE SURGERY Right    "fell out of crib when I was a baby; busted it open"  . HAND SURGERY Left 2000   "degloving injury; vein graft, skin graft"  . HERNIA REPAIR    . INGUINAL HERNIA REPAIR Left   . LUMBAR  LAMINECTOMY    . TONSILLECTOMY  1984  . UMBILICAL HERNIA REPAIR     Family History:  Family History  Problem Relation Age of Onset  . Hypertension Other    Family Psychiatric  History: See H&P Social History:  Social History   Substance and Sexual Activity  Alcohol Use Yes  . Alcohol/week: 50.4 oz  . Types: 84 Cans of beer per week   Comment: 07/17/2017 "12 beers or more day "     Social History   Substance and Sexual Activity  Drug Use Yes  . Types: Cocaine, Marijuana    Social History   Socioeconomic History  . Marital status: Married    Spouse name: None  . Number of children: None  . Years of education: None  . Highest education level: None  Social Needs  . Financial resource strain: None  . Food insecurity - worry: None  . Food insecurity - inability: None  . Transportation needs - medical: None  . Transportation needs - non-medical: None  Occupational History  . None  Tobacco Use  . Smoking status: Current Every Day Smoker    Packs/day: 1.00    Years: 29.00    Pack years: 29.00    Types: Cigarettes  . Smokeless tobacco: Never Used  Substance and Sexual Activity  . Alcohol use: Yes    Alcohol/week: 50.4 oz    Types: 84 Cans of beer per week  Comment: 07/17/2017 "12 beers or more day "  . Drug use: Yes    Types: Cocaine, Marijuana  . Sexual activity: Not Currently  Other Topics Concern  . None  Social History Narrative  . None   Additional Social History:                         Sleep: Good  Appetite:  Good  Current Medications: Current Facility-Administered Medications  Medication Dose Route Frequency Provider Last Rate Last Dose  . alum & mag hydroxide-simeth (MAALOX/MYLANTA) 200-200-20 MG/5ML suspension 30 mL  30 mL Oral Q6H PRN Izediuno, Delight Ovens, MD   30 mL at 07/21/17 0825  . feeding supplement (ENSURE ENLIVE) (ENSURE ENLIVE) liquid 237 mL  237 mL Oral BID BM Rankin, Shuvon B, NP   237 mL at 07/24/17 0803  . gabapentin  (NEURONTIN) capsule 400 mg  400 mg Oral TID Lynnet Hefley, Rockey Situ, MD   400 mg at 07/24/17 1158  . LORazepam (ATIVAN) tablet 0.5 mg  0.5 mg Oral Q6H PRN Bentlee Benningfield, Rockey Situ, MD   0.5 mg at 07/23/17 1814  . magnesium hydroxide (MILK OF MAGNESIA) suspension 30 mL  30 mL Oral Daily PRN Money, Feliz Beam B, FNP   30 mL at 07/23/17 1212  . metoprolol tartrate (LOPRESSOR) tablet 12.5 mg  12.5 mg Oral BID Rankin, Shuvon B, NP   12.5 mg at 07/24/17 0802  . mirtazapine (REMERON) tablet 7.5 mg  7.5 mg Oral QHS Money, Travis B, FNP   7.5 mg at 07/23/17 2118  . nicotine (NICODERM CQ - dosed in mg/24 hours) patch 21 mg  21 mg Transdermal Daily Rankin, Shuvon B, NP   21 mg at 07/24/17 0802  . nitroGLYCERIN (NITROSTAT) SL tablet 0.4 mg  0.4 mg Sublingual Q5 min PRN Oneta Rack, NP   0.4 mg at 07/21/17 2126  . sertraline (ZOLOFT) tablet 25 mg  25 mg Oral Daily Noelle Hoogland, Rockey Situ, MD   25 mg at 07/24/17 0802  . thiamine (VITAMIN B-1) tablet 100 mg  100 mg Oral Daily Rankin, Shuvon B, NP   100 mg at 07/24/17 0802  . traZODone (DESYREL) tablet 50 mg  50 mg Oral QHS PRN Jayjay Littles, Rockey Situ, MD        Lab Results: No results found for this or any previous visit (from the past 48 hour(s)).  Blood Alcohol level:  Lab Results  Component Value Date   ETH <10 07/16/2017   ETH  02/20/2010    5        LOWEST DETECTABLE LIMIT FOR SERUM ALCOHOL IS 5 mg/dL FOR MEDICAL PURPOSES ONLY    Metabolic Disorder Labs: Lab Results  Component Value Date   HGBA1C 5.0 07/18/2017   MPG 96.8 07/18/2017   No results found for: PROLACTIN Lab Results  Component Value Date   CHOL 178 07/18/2017   TRIG 160 (H) 07/18/2017   HDL 41 07/18/2017   CHOLHDL 4.3 07/18/2017   VLDL 32 07/18/2017   LDLCALC 105 (H) 07/18/2017    Physical Findings: AIMS: Facial and Oral Movements Muscles of Facial Expression: None, normal Lips and Perioral Area: None, normal Jaw: None, normal Tongue: None, normal,Extremity Movements Upper (arms, wrists,  hands, fingers): None, normal Lower (legs, knees, ankles, toes): None, normal, Trunk Movements Neck, shoulders, hips: None, normal, Overall Severity Severity of abnormal movements (highest score from questions above): None, normal Incapacitation due to abnormal movements: None, normal Patient's awareness of abnormal movements (rate  only patient's report): No Awareness, Dental Status Current problems with teeth and/or dentures?: No Does patient usually wear dentures?: No  CIWA:  CIWA-Ar Total: 4 COWS:  COWS Total Score: 5  Musculoskeletal: Strength & Muscle Tone: within normal limits Gait & Station: normal Patient leans: N/A  Psychiatric Specialty Exam: Physical Exam  Nursing note and vitals reviewed. Constitutional: He is oriented to person, place, and time. He appears well-developed and well-nourished.  Respiratory: Effort normal.  Musculoskeletal: Normal range of motion.  Neurological: He is alert and oriented to person, place, and time.  Skin: Skin is warm.    Review of Systems  Constitutional: Negative.   HENT: Negative.   Eyes: Negative.   Respiratory: Negative.   Cardiovascular: Negative.   Gastrointestinal: Negative.   Genitourinary: Negative.   Musculoskeletal: Negative.   Skin: Negative.   Neurological: Negative.   Endo/Heme/Allergies: Negative.   Psychiatric/Behavioral: The patient is nervous/anxious.     Blood pressure (!) 142/100, pulse (!) 105, temperature 98.2 F (36.8 C), temperature source Oral, resp. rate 16, height 5\' 10"  (1.778 m), weight 73 kg (161 lb), SpO2 100 %.Body mass index is 23.1 kg/m.  General Appearance: Casual  Eye Contact:  Good  Speech:  Clear and Coherent and Normal Rate  Volume:  Normal  Mood:  Anxious  Affect:  Congruent  Thought Process:  Goal Directed and Descriptions of Associations: Intact  Orientation:  Full (Time, Place, and Person)  Thought Content:  WDL  Suicidal Thoughts:  No  Homicidal Thoughts:  No  Memory:   Immediate;   Good Recent;   Good Remote;   Good  Judgement:  Good  Insight:  Good  Psychomotor Activity:  Normal  Concentration:  Concentration: Good and Attention Span: Good  Recall:  Good  Fund of Knowledge:  Good  Language:  Good  Akathisia:  No  Handed:  Right  AIMS (if indicated):     Assets:  Communication Skills Desire for Improvement Financial Resources/Insurance Housing Physical Health Social Support Transportation  ADL's:  Intact  Cognition:  WNL  Sleep:  Number of Hours: 5   Problems Addressed: MDD severe Polysubstance abuse  Treatment Plan Summary: Daily contact with patient to assess and evaluate symptoms and progress in treatment, Medication management and Plan is to:  -Continue Zoloft 25 mg PO Daily for mood stability -Continue Gabapentin 400 mg PO TID for withdrawal symptoms and agitation -Continue Remeron 7.5 mg PO QHS for insomnia -Continue Ativan 0.5 mg PO Q6H PRN for anxiety -Continue Trazodone 50 mg PO QHS PRN for insomnia -Encourage group therapy participation  Maryfrances Bunnell, FNP 07/24/2017, 2:05 PM   Agree with Progress Note

## 2017-07-24 NOTE — Progress Notes (Signed)
D: Patient observed up and restless on the unit. Patient states he is having HI towards his wife. Denies plan, intent. Considering changing of code to limit contact. Patient's affect animated, mood anxious. Patient visibly less anxious, fidgety than yesterday. Per self inventory and discussions with writer, rates depression at a 5/10, hopelessness at a 5/10 and anxiety at a 5/10. Rates sleep as poor, appetite as fair, energy as normal and concentration as poor.  States goal for today is to "find program, pursue leads." Denies pain, physical complaints. BP elevated at lunch time however patient agitated over phone call from wife and had just returned from rec time.   A: Medicated per orders, no prns requested or required. Level III obs in place for safety. Emotional support offered and self inventory reviewed. Encouraged completion of Suicide Safety Plan and programming participation. Discussed POC with MD, SW.    R: Patient verbalizes understanding of POC. Patient denies SI/AVH and remains safe on level III obs. Will continue to monitor closely and make verbal contact frequently.

## 2017-07-24 NOTE — [facility] Group Notes (Signed)
LCSW Group Therapy Note  07/24/2017 1:15pm  Type of Therapy and Topic:  Group Therapy: Avoiding Self-Sabotaging and Enabling Behaviors  Participation Level:  Did Not Attend-pt invited. Chose to remain in bed.   Description of Group:   In this group, patients will learn how to identify obstacles, self-sabotaging and enabling behaviors, as well as: what are they, why do we do them and what needs these behaviors meet. Discuss unhealthy relationships and how to have positive healthy boundaries with those that sabotage and enable. Explore aspects of self-sabotage and enabling in yourself and how to limit these self-destructive behaviors in everyday life.   Therapeutic Goals: 1. Patient will identify one obstacle that relates to self-sabotage and enabling behaviors 2. Patient will identify one personal self-sabotaging or enabling behavior they did prior to admission 3. Patient will state a plan to change the above identified behavior 4. Patient will demonstrate ability to communicate their needs through discussion and/or role play.   Summary of Patient Progress:  x   Therapeutic Modalities:   Cognitive Behavioral Therapy Person-Centered Therapy Motivational Interviewing   [employer], LCSW 07/24/2017 3:38 PM

## 2017-07-24 NOTE — Progress Notes (Signed)
Recreation Therapy Notes  Date: 07/24/17 Time: 0930 Location: 300 Hall Dayroom  Group Topic: Stress Management  Goal Area(s) Addresses:  Patient will verbalize importance of using healthy stress management.  Patient will identify positive emotions associated with healthy stress management.   Behavioral Response: Engaged  Intervention: Stress Management  Activity : Guided Imagery.  LRT introduced the stress management technique of guided imagery.  LRT read a script that allowed patients to visualize themselves along the beach.  Patients were to listen and follow along as script was read to engage in the activity.  Education: Stress Management, Discharge Planning.   Education Outcome: Acknowledges edcuation/In group clarification offered/Needs additional education  Clinical Observations/Feedback: Pt attended group.   Caroll Rancher, LRT/CTRS          Caroll Rancher A 07/24/2017 11:52 AM

## 2017-07-24 NOTE — Progress Notes (Signed)
D: Pt denies SI/HI/AV. Pt is pleasant and cooperative. Pt goal for today is to work on his coping skills.  A: Pt was offered support and encouragement. Pt was given scheduled medications. Pt was encourage to attend groups. Q 15 minute checks were done for safety.  R:Pt attends groups and interacts well with peers and staff. Pt is taking medication. Pt has no complaints.Pt receptive to treatment and safety maintained on unit.

## 2017-07-25 MED ORDER — MIRTAZAPINE 7.5 MG PO TABS: 7.5000 mg | ORAL_TABLET | Freq: Every day | ORAL | 0 refills | Status: DC

## 2017-07-25 MED ORDER — METOPROLOL TARTRATE 25 MG PO TABS: 12.5000 mg | ORAL_TABLET | Freq: Two times a day (BID) | ORAL | 0 refills | Status: DC

## 2017-07-25 MED ORDER — SERTRALINE HCL 25 MG PO TABS: 25.0000 mg | ORAL_TABLET | Freq: Every day | ORAL | 0 refills | Status: DC

## 2017-07-25 MED ORDER — TRAZODONE HCL 50 MG PO TABS: 50.0000 mg | ORAL_TABLET | Freq: Every evening | ORAL | 0 refills | Status: DC | PRN

## 2017-07-25 MED ORDER — GABAPENTIN 400 MG PO CAPS: 400.0000 mg | ORAL_CAPSULE | Freq: Three times a day (TID) | ORAL | 0 refills | Status: DC

## 2017-07-25 MED ORDER — NITROGLYCERIN 0.4 MG SL SUBL: 0.4000 mg | SUBLINGUAL_TABLET | SUBLINGUAL | 1 refills | Status: DC | PRN

## 2017-07-25 NOTE — Progress Notes (Addendum)
CSW spoke with Antwoin (In admissions) from [facility] through [employer] [facility]. Pt accepted to treatment and also spoke with Antwoin today about admission today. He must be there as close to 12pm as possible. Pt was working with Antwoin and pt's wife to secure immediate transportation. Pt requires 30 day supply of medication. Reola Calkins NP has called in his prescriptions to [facility] on [address] per patient request. Pt presents with anxious mood, as he is worried about interacting with his wife during drive to facility. CSW spoke with pt about this at length. Pt is waiting for clothing to dry and will be ready for discharge by 11:30AM.   Trula Slade, MSW, LCSW Clinical Social Worker 07/25/2017 10:47 AM   Pt has also turned down his other options for treatment including: [facility] and [facility]. "My wife won't want me to go to those places because it is co-ed."   Trula Slade, MSW, LCSW Clinical Social Worker 07/25/2017 10:48 AM

## 2017-07-25 NOTE — [facility] Suicide Risk Assessment (Signed)
[facility] Discharge Suicide Risk Assessment   Principal Problem: MDD (major depressive disorder), severe [facility]) Discharge Diagnoses:  Patient Active Problem List   Diagnosis Date Noted  . MDD (major depressive disorder), severe (HCC) [F32.2] 07/19/2017  . Adjustment disorder with mixed disturbance of emotions and conduct [F43.25]   . Polysubstance abuse (HCC) [F19.10] 07/16/2017  . Suicidal behavior [R46.89] 07/16/2017    Total Time spent with patient: 30 minutes  Musculoskeletal: Strength & Muscle Tone: within normal limits- no tremors noted today. Gait & Station: normal Patient leans: N/A  Psychiatric Specialty Exam: ROS denies headache, no chest pain, no shortness of breath, reports nausea but  no vomiting   Blood pressure 108/63, pulse 95, temperature 97.6 F (36.4 C), temperature source Oral, resp. rate 16, height 5\' 10"  (1.778 m), weight 73 kg (161 lb), SpO2 100 %.Body mass index is 23.1 kg/m.  General Appearance: Well Groomed  Patent attorney::  Fair  Speech:  Normal Rate409  Volume:  Normal  Mood:  reports improved mood, states he remains anxious   Affect:  mildly anxious  Thought Process:  Linear and Descriptions of Associations: Intact  Orientation:  Other:  fully alert and attentive  Thought Content:  denies hallucinations, no delusions, not internally preoccupied   Suicidal Thoughts:  No denies suicidal ideations, no self injurious ideations  Homicidal Thoughts:  No denies homicidal or violent ideations  Memory:  recent and remote grossly intact   Judgement:  Other:  improving   Insight:  improving   Psychomotor Activity:  Normal- no psychomotor agitation or tremors noted at this time  Concentration:  Good  Recall:  Good  Fund of Knowledge:Good  Language: Good  Akathisia:  Negative  Handed:  Right  AIMS (if indicated):     Assets:  Communication Skills Desire for Improvement Resilience  Sleep:  Number of Hours: 5.5  Cognition: WNL  ADL's:  Intact   Mental Status  Per Nursing Assessment::   On Admission:  Suicidal ideation indicated by patient  Demographic Factors:  47 year old , married, has three children, currently unemployed   Loss Factors: Substance abuse . Marital stressors. Unemployment .  Historical Factors: Prior admission for detox . Reports history of anxiety, states he has a history of excessive worrying . States he has never attempted suicide .   Risk Reduction Factors:   Positive coping skills or problem solving skills  Continued Clinical Symptoms:  At this time patient is alert , attentive, well groomed, mood is improved, reports ongoing anxiety but today not presenting with tremors or psychomotor agitation, no thought disorder, no suicidal or self injurious ideations, no homicidal ideations,no psychotic symptoms , future oriented . Reports some nausea related to medication management, no other side effects- no vomiting . Behavior on unit in good control, presents vaguely anxious but pleasant on approach. Future oriented, planning on going to a residential rehab program at discharge ( [facility])    Cognitive Features That Contribute To Risk:  No gross cognitive deficits noted upon discharge. Is alert , attentive, and oriented x 3    Suicide Risk:  Mild:  Suicidal ideation of limited frequency, intensity, duration, and specificity.  There are no identifiable plans, no associated intent, mild dysphoria and related symptoms, good self-control (both objective and subjective assessment), few other risk factors, and identifiable protective factors, including available and accessible social support.  Follow-up Information    [facility] Follow up.   Why:  Referral faxed: 07/24/17.  Contact information: [address], [place] [address] Phone: [phone] Fax: [phone]       [facility] Follow up.   Contact information: [address] [place], [place] [address] Phone: [phone] Fax: [phone]           Plan Of Care/Follow-up recommendations:  Activity:  as tolerated  Diet:  heart healthy Tests:  NA Other:  See below   Patient is expressing readiness for discharge, plans to go to [employer]. States wife will transport him to above program. Follow up as above . Has a PCP ( Dr. Su Hilt) in [facility] for medical issues as needed   Craige Cotta, MD 07/25/2017, 10:11 AM

## 2017-07-25 NOTE — Progress Notes (Signed)
Pt reports that his day has been ok.  He tells Clinical research associate that he mostly likely will discharge tomorrow, and he is supposed to call [facility] in [place] to make sure they have a bed for him.  He has voiced no needs or concerns tonight.  He was happy that his daughter was able to come visit with him tonight.  He makes his needs known to staff.  Support and encouragement offered.  Discharge plans are in process.  Safety maintained with q15 minute checks.

## 2017-07-25 NOTE — Progress Notes (Signed)
  [facility] Adult Case Management Discharge Plan :  Will you be returning to the same living situation after discharge:  No.Pt accepted to [facility] program At discharge, do you have transportation home?: Yes,  wife Do you have the ability to pay for your medications: Yes,  private insurance  Release of information consent forms completed and submitted to medical records by CSW.  Patient to Follow up at: Follow-up Information    [facility] Follow up on 07/25/2017.   Why:  Please arrive at approximately 12pm to be admitted into program. 30 day supply of medications required. Jared Robinson is contact person. Thank you.  Contact information: [address] [place], [place] [address] Phone: [phone] Fax: [phone]          Next level of care provider has access to [facility]:no  Safety Planning and Suicide Prevention discussed: Yes,  SPE completed with pt's wife and with pt. Pt provided with SPI pamphlet and mobile crisis information.  Have you used any form of tobacco in the last 30 days? (Cigarettes, Smokeless Tobacco, Cigars, and/or Pipes): Yes  Has patient been referred to the Quitline?: Patient refused referral  Patient has been referred for addiction treatment: Yes  [employer], LCSW 07/25/2017, 10:44 AM
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
## 2017-07-25 NOTE — Progress Notes (Signed)
[facility] Group Notes:  (Nursing/MHT/Case Management/Adjunct)  Date:  07/25/2017  Time: 0830  Type of Therapy:  Nurse Education  Participation Level:  Active  Participation Quality:  Appropriate  Affect:  Appropriate  Cognitive:  Alert and Oriented  Insight:  Appropriate  Engagement in Group:  Engaged  Modes of Intervention:  Activity, Discussion, Education, Socialization and Support  Summary of Progress/Problems:The purpose of this group is to introduce and educate patients to the benefits of aromatherapy. This patient participated and was engaged in group.  Beatrix Shipper 07/25/2017, 9:41 AM

## 2017-07-25 NOTE — Progress Notes (Signed)
Patient ID: Jared Robinson, male   DOB: 04-25-1971, 46 y.o.   MRN: [id number]  Discharge Note- Pt. Denies SI/HI and A/V hallucinations. He reports that his sleep last night was fair, his appetite is fair, his energy level is normal, and his concentration level is good. He rates his depression, hopelessness, and anxiety levels 5/10. Patient reports some irritability and cravings. Belongings returned to patient at time of discharge. Discharge instructions and medications were reviewed with patient. Patient verbalized understanding of both medications and discharge instructions. Patient discharged to the lobby where his ride was waiting. Q15 minute safety checks maintained until time of discharge.

## 2017-08-19 ENCOUNTER — Ambulatory Visit: Payer: BLUE CROSS/BLUE SHIELD | Admitting: Cardiology

## 2017-08-19 ENCOUNTER — Encounter: Payer: Self-pay | Admitting: Cardiology

## 2017-08-19 VITALS — BP 128/74 | HR 93 | Ht 70.0 in | Wt 186.6 lb

## 2017-08-19 DIAGNOSIS — F1910 Other psychoactive substance abuse, uncomplicated: Secondary | ICD-10-CM | POA: Diagnosis not present

## 2017-08-19 DIAGNOSIS — I420 Dilated cardiomyopathy: Secondary | ICD-10-CM | POA: Insufficient documentation

## 2017-08-19 DIAGNOSIS — R079 Chest pain, unspecified: Secondary | ICD-10-CM

## 2017-08-19 MED ORDER — ISOSORBIDE MONONITRATE ER 30 MG PO TB24: 30.0000 mg | ORAL_TABLET | Freq: Every day | ORAL | 3 refills | Status: DC

## 2017-08-19 MED ORDER — CARVEDILOL 6.25 MG PO TABS: 6.2500 mg | ORAL_TABLET | Freq: Two times a day (BID) | ORAL | 6 refills | Status: DC

## 2017-08-19 NOTE — Patient Instructions (Signed)
MEDICATION INSTRUCTIONS    START ISOSORBIDE MONO 30 MG  ONE TABLET DAILY   FINISH TAKING METOPROLOL THEN START CARVEDILOL 3.125 MG ONE TABLET TWICE A DAY.    SCHEDULE AT [address]  Your physician has requested that you have en exercise stress myoview. For further information please visit [email or web address] Please follow instruction sheet, as given. DO NOT TAKE CARVEDILOL THE NIGHT BEFORE TEST OR THE MORNING OF THE TEST TAKE AFTER.     Your physician recommends that you schedule a follow-up appointment in 1 MONTH DR HARDING.

## 2017-08-19 NOTE — Progress Notes (Signed)
PCP: Burton Apley, MD  Clinic Note: Chief Complaint  Patient presents with  . Chest Pain    pt states having a sharp shooting pain everyday in his chest, somtimes feelis like a screw driver goint into his chest     . Shortness of Breath    states some SOB with and without activity     HPI: Jared Robinson is a 47 y.o. male with a PMH below who presents today for hospitalization follow-up after soon visit with chest pain (associated with cocaine use).. He has a history of polysubstance/cocaine abuse and chest pain related to cocaine.  There is also a diagnosis of acute heart failure.  ALESSIO BOGAN was referred as part of his discharge planning from the initial stage of his hospitalization prior to going to behavioral health for substance abuse counseling. --He has now quit smoking and has been abstinent of alcohol and cocaine as well as other drugs.  Recent Hospitalizations:   July 16, 2017: presented to the ER w/ 2 months of chest pain off and on, coincident w/ using cocaine.  In addition patient admitted to be having suicidal ideation due to family issues / pending divorce.   Studies Personally Reviewed - (if available, images/films reviewed: From Epic Chart or Care Everywhere)  Transthoracic echo January 2019: Mild to moderate reduced EF 40-45% with diffuse hypokinesis.  Otherwise normal.  Interval History: Jared Robinson is a 47 year old gentleman (who appears much older than his stated age) with a history of tobacco abuse and polysubstance abuse who presents here with still persistent intermittent episodes of chest discomfort which is been nitroglycerin responsive.  He has noted having chest discomfort with and without exertion for the last 2 months now.  He says that these episodes can last seconds to minutes.  Usually notes it is a stabbing sharp pain associated with some palpitations and a quivering sensation in his chest.  The morning he went to the hospital, the episode lasted a  prolonged amount of time (roughly half an hour or more).  He also notes some symptoms of orthopnea, and waking up short of breath, but not real PND.  No edema.  Other than the palpitations he feels with his chest pain episodes no prolonged episodes of rapid irregular heartbeats.  No syncope/near syncope or TIA/amaurosis fugax symptoms. Another relatively interesting symptom that he describes is having spells where he needs to almost ER on or take a deep breath just to catch his breath and feel like he gets enough oxygen.  He had these episodes at least 10 times while seeing him in the clinic today.  He simply stops and takes a deep breath in and slowly lets it out, and then continue this conversation.  No claudication symptoms. No melena, hematochezia or hematuria or epistaxis.  ROS: A comprehensive was performed. Review of Systems  Constitutional: Positive for malaise/fatigue. Negative for weight loss (In fact he has gained quite a bit of weight having stopped smoking and cocaine).  HENT: Negative for congestion and nosebleeds.   Respiratory: Positive for cough (Chronic morning cough), shortness of breath (Per HPI) and wheezing. Negative for sputum production.   Cardiovascular:       Per HPI  Gastrointestinal: Positive for heartburn. Negative for abdominal pain, constipation and diarrhea.  Musculoskeletal: Positive for myalgias (Off and on in his calf, not necessarily associated with exertion.). Negative for back pain, falls and joint pain.  Neurological: Negative for dizziness and weakness.  Psychiatric/Behavioral: Positive for substance abuse (Working  on abstinence.). Negative for memory loss. The patient is nervous/anxious and has insomnia.   All other systems reviewed and are negative.  I have reviewed and (if needed) personally updated the patient's problem list, medications, allergies, past medical and surgical history, social and family history.   Past Medical History:  Diagnosis Date    . Anxiety   . Chronic bronchitis (HCC)   . Chronic lower back pain   . COPD (chronic obstructive pulmonary disease) (HCC)   . History of blood transfusion 1984   related to "tonsillectomy" (07/17/2017)  . IBS (irritable bowel syndrome)     Past Surgical History:  Procedure Laterality Date  . EYE SURGERY Right    "fell out of crib when I was a baby; busted it open"  . HAND SURGERY Left 2000   "degloving injury; vein graft, skin graft"  . HERNIA REPAIR    . INGUINAL HERNIA REPAIR Left   . LUMBAR LAMINECTOMY    . TONSILLECTOMY  1984  . TRANSTHORACIC ECHOCARDIOGRAM  07/2017   Mild to moderate reduced EF 40-45% with diffuse hypokinesis.  Otherwise normal.  . UMBILICAL HERNIA REPAIR      Current Meds  Medication Sig  . gabapentin (NEURONTIN) 400 MG capsule Take 1 capsule (400 mg total) by mouth 3 (three) times daily.  . mirtazapine (REMERON) 7.5 MG tablet Take 1 tablet (7.5 mg total) by mouth at bedtime. For mood stability  . nitroGLYCERIN (NITROSTAT) 0.4 MG SL tablet Place 1 tablet (0.4 mg total) under the tongue every 5 (five) minutes as needed for chest pain (CP or SOB).  Marland Kitchen sertraline (ZOLOFT) 25 MG tablet Take 1 tablet (25 mg total) by mouth daily. For mood control  . traZODone (DESYREL) 50 MG tablet Take 1 tablet (50 mg total) by mouth at bedtime as needed for sleep.  . [DISCONTINUED] metoprolol tartrate (LOPRESSOR) 25 MG tablet Take 0.5 tablets (12.5 mg total) by mouth 2 (two) times daily.    Allergies  Allergen Reactions  . Aspirin Rash    Social History   Tobacco Use  . Smoking status: Current Every Day Smoker    Packs/day: 1.00    Years: 29.00    Pack years: 29.00    Types: Cigarettes  . Smokeless tobacco: Never Used  Substance Use Topics  . Alcohol use: Yes    Alcohol/week: 50.4 oz    Types: 84 Cans of beer per week    Comment: 07/17/2017 "12 beers or more day "  . Drug use: Yes    Types: Cocaine, Marijuana   Social History   Social History Narrative    Recently discharged from behavioral health: Indicates that he has not had a drink or any cocaine/marijuana since discharge.  He is also trying to be abstinent of cigarettes, but having a hard time.     family history includes Esophageal cancer in his father; Hypertension in his other.  Wt Readings from Last 3 Encounters:  08/19/17 186 lb 9.6 oz (84.6 kg)  07/19/17 161 lb 12.8 oz (73.4 kg)  02/08/14 160 lb 6.4 oz (72.8 kg)    PHYSICAL EXAM BP 128/74   Pulse 93   Ht 5\' 10"  (1.778 m)   Wt 186 lb 9.6 oz (84.6 kg)   SpO2 97%   BMI 26.77 kg/m  Physical Exam  Constitutional: He is oriented to person, place, and time. He appears well-developed and well-nourished. No distress (Just very anxious).  Healthy-appearing, but appears older than stated age.  Well-groomed.  HENT:  Head: Normocephalic and atraumatic.  Mouth/Throat: No oropharyngeal exudate.  Poor dentition  Eyes: EOM are normal. Pupils are equal, round, and reactive to light. No scleral icterus.  Neck: Normal range of motion. Neck supple. No JVD present.  Cardiovascular: Normal rate, regular rhythm, normal heart sounds and normal pulses.  Occasional extrasystoles are present. PMI is not displaced. Exam reveals no gallop and no friction rub.  No murmur heard. Pulmonary/Chest: Effort normal. No respiratory distress. He has wheezes (Occasional diffuse). He has no rales. He exhibits tenderness (Upper sternal border).  Abdominal: Soft. Bowel sounds are normal. He exhibits no distension. There is no tenderness. There is no rebound.  Musculoskeletal: Normal range of motion. He exhibits no edema or deformity.  Neurological: He is alert and oriented to person, place, and time. No cranial nerve deficit.  Skin: Skin is warm and dry. No rash noted. No erythema.  Psychiatric: He has a normal mood and affect. His behavior is normal. Judgment and thought content normal.  Besides being a little anxious, he actually seem to be relatively stable    Nursing note and vitals reviewed.    Adult ECG Report -last evaluation was January 6 (not done today)  Rate: 83;  Rhythm: normal sinus rhythm and Normal axis, intervals and durations.;   Narrative Interpretation: Normal EKG   Other studies Reviewed: Additional studies/ records that were reviewed today include:  Recent Labs:   Lab Results  Component Value Date   CREATININE 1.05 07/19/2017   BUN 12 07/19/2017   NA 134 (L) 07/19/2017   K 4.0 07/19/2017   CL 99 (L) 07/19/2017   CO2 27 07/19/2017   Lab Results  Component Value Date   CHOL 178 07/18/2017   HDL 41 07/18/2017   LDLCALC 105 (H) 07/18/2017   TRIG 160 (H) 07/18/2017   CHOLHDL 4.3 07/18/2017    ASSESSMENT / PLAN: Problem List Items Addressed This Visit    Chest pain with moderate risk for cardiac etiology    Intermittent episodes of resting and exertional chest pain that has both classic and atypical features.  Given his long-standing history of smoking and reduced EF on echo, we need to exclude ischemic etiology. Plan: Treadmill Myoview Stress Test Convert from Lopressor to carvedilol Add Imdur 30 mg  Depending on stress test results for ischemic evaluation, may need to consider more aggressive lipid management.  Presently his LDL is relatively well within goal for his age.      Dilated cardiomyopathy (HCC) (Chronic)    Concerning reduced ejection fraction in a patient with history of cocaine and alcohol abuse as well as marijuana.  Quite likely this could be related to substance abuse, however he is noting exertional chest discomfort.  Need to exclude ischemic etiology. Given his age, I think appropriate of action would be to proceed with Myoview stress test.  Will convert from metoprolol to carvedilol for better cardia myopathy coverage, as well as better coverage for someone whose history of cocaine use.  As blood pressure tolerates, would consider ARB, however for now is more of a tachycardia palpitations  symptom that I will try to treat (also to be excluded CAD)      Relevant Medications   carvedilol (COREG) 6.25 MG tablet   isosorbide mononitrate (IMDUR) 30 MG 24 hr tablet   Other Relevant Orders   MYOCARDIAL PERFUSION IMAGING   Polysubstance abuse (HCC) - Primary   Relevant Orders   MYOCARDIAL PERFUSION IMAGING      Current medicines are reviewed  at length with the patient today. (+/- concerns) n/a The following changes have been made: see below  Patient Instructions  MEDICATION INSTRUCTIONS    START ISOSORBIDE MONO 30 MG  ONE TABLET DAILY   FINISH TAKING METOPROLOL THEN START CARVEDILOL 3.125 MG ONE TABLET TWICE A DAY.    SCHEDULE AT [address]  Your physician has requested that you have en exercise stress myoview. For further information please visit [email or web address] Please follow instruction sheet, as given. DO NOT TAKE CARVEDILOL THE NIGHT BEFORE TEST OR THE MORNING OF THE TEST TAKE AFTER.     Your physician recommends that you schedule a follow-up appointment in 1 MONTH DR Sora Olivo.    Studies Ordered:   Orders Placed This Encounter  Procedures  . MYOCARDIAL PERFUSION IMAGING      Bryan Lemma, M.D., M.S. Interventional Cardiologist   Pager # [phone] Phone # [phone] [address] [place], [place] [address]   Thank you for choosing [facility] at [facility]!!

## 2017-08-21 ENCOUNTER — Encounter: Payer: Self-pay | Admitting: Cardiology

## 2017-08-21 NOTE — Assessment & Plan Note (Addendum)
Intermittent episodes of resting and exertional chest pain that has both classic and atypical features.  Given his long-standing history of smoking and reduced EF on echo, we need to exclude ischemic etiology. Plan: Treadmill Myoview Stress Test Convert from Lopressor to carvedilol Add Imdur 30 mg  Depending on stress test results for ischemic evaluation, may need to consider more aggressive lipid management.  Presently his LDL is relatively well within goal for his age.

## 2017-08-21 NOTE — Assessment & Plan Note (Signed)
Concerning reduced ejection fraction in a patient with history of cocaine and alcohol abuse as well as marijuana.  Quite likely this could be related to substance abuse, however he is noting exertional chest discomfort.  Need to exclude ischemic etiology. Given his age, I think appropriate of action would be to proceed with Myoview stress test.  Will convert from metoprolol to carvedilol for better cardia myopathy coverage, as well as better coverage for someone whose history of cocaine use.  As blood pressure tolerates, would consider ARB, however for now is more of a tachycardia palpitations symptom that I will try to treat (also to be excluded CAD)

## 2017-08-23 ENCOUNTER — Telehealth (HOSPITAL_COMMUNITY): Payer: Self-pay

## 2017-08-23 NOTE — Telephone Encounter (Signed)
Encounter complete. 

## 2017-08-28 ENCOUNTER — Inpatient Hospital Stay (HOSPITAL_COMMUNITY)
Admission: RE | Admit: 2017-08-28 | Payer: BLUE CROSS/BLUE SHIELD | Source: Ambulatory Visit | Attending: Cardiology | Admitting: Cardiology

## 2017-09-11 ENCOUNTER — Telehealth (HOSPITAL_COMMUNITY): Payer: Self-pay

## 2017-09-11 NOTE — Telephone Encounter (Signed)
Encounter complete. 

## 2017-09-12 ENCOUNTER — Ambulatory Visit (HOSPITAL_COMMUNITY)
Admission: RE | Admit: 2017-09-12 | Payer: BLUE CROSS/BLUE SHIELD | Source: Ambulatory Visit | Attending: Cardiology | Admitting: Cardiology

## 2017-09-13 ENCOUNTER — Ambulatory Visit (HOSPITAL_COMMUNITY)
Admission: RE | Admit: 2017-09-13 | Discharge: 2017-09-13 | Disposition: A | Discharge status: Home / Self Care | Payer: BLUE CROSS/BLUE SHIELD | Source: Ambulatory Visit | Attending: Cardiology | Admitting: Cardiology

## 2017-09-13 DIAGNOSIS — F1910 Other psychoactive substance abuse, uncomplicated: Secondary | ICD-10-CM

## 2017-09-13 DIAGNOSIS — I420 Dilated cardiomyopathy: Secondary | ICD-10-CM

## 2017-09-13 LAB — MYOCARDIAL PERFUSION IMAGING
CHL CUP NUCLEAR SDS: 1
CHL CUP NUCLEAR SRS: 1
CHL CUP NUCLEAR SSS: 2
CSEPEDS: 0 s
CSEPEW: 11.7 METS
Exercise duration (min): 11 min
LVDIAVOL: 127 mL (ref 62–150)
LVSYSVOL: 59 mL
MPHR: 175 {beats}/min
Peak HR: 162 {beats}/min
Percent HR: 92 %
RPE: 17
Rest HR: 75 {beats}/min
TID: 1.12

## 2017-09-13 MED ORDER — TECHNETIUM TC 99M TETROFOSMIN IV KIT
10.1000 | PACK | Freq: Once | INTRAVENOUS | Status: AC | PRN
  Administered 2017-09-13: 10.1 via INTRAVENOUS
  Filled 2017-09-13: qty 11

## 2017-09-13 MED ORDER — TECHNETIUM TC 99M TETROFOSMIN IV KIT
31.6000 | PACK | Freq: Once | INTRAVENOUS | Status: AC | PRN
  Administered 2017-09-13: 31.6 via INTRAVENOUS
  Filled 2017-09-13: qty 32

## 2017-09-20 ENCOUNTER — Ambulatory Visit: Payer: BLUE CROSS/BLUE SHIELD | Admitting: Cardiology

## 2017-09-20 VITALS — BP 142/78 | HR 93 | Ht 70.0 in | Wt 190.4 lb

## 2017-09-20 DIAGNOSIS — I10 Essential (primary) hypertension: Secondary | ICD-10-CM | POA: Diagnosis not present

## 2017-09-20 DIAGNOSIS — R079 Chest pain, unspecified: Secondary | ICD-10-CM | POA: Diagnosis not present

## 2017-09-20 DIAGNOSIS — I420 Dilated cardiomyopathy: Secondary | ICD-10-CM | POA: Diagnosis not present

## 2017-09-20 DIAGNOSIS — G4700 Insomnia, unspecified: Secondary | ICD-10-CM | POA: Insufficient documentation

## 2017-09-20 MED ORDER — AMLODIPINE BESYLATE 2.5 MG PO TABS: 2.5000 mg | ORAL_TABLET | Freq: Every day | ORAL | 3 refills | Status: DC

## 2017-09-20 MED ORDER — CARVEDILOL 6.25 MG PO TABS: 6.2500 mg | ORAL_TABLET | Freq: Two times a day (BID) | ORAL | 6 refills | Status: DC

## 2017-09-20 MED ORDER — TRAZODONE HCL 50 MG PO TABS: 50.0000 mg | ORAL_TABLET | Freq: Every evening | ORAL | 2 refills | Status: DC | PRN

## 2017-09-20 NOTE — Progress Notes (Signed)
PCP: Burton Apley, MD  Clinic Note: Chief Complaint  Patient presents with  . Follow-up    Chest pain evaluation; results of echo and stress test  Dictation #1 [id number]  [id number]  no PCP  HPI: KIAM BRANSFIELD is a 47 y.o. male with a PMH below who presents today for hospitalization follow-up after soon visit with chest pain (associated with cocaine use).. He has a history of polysubstance/cocaine abuse and chest pain related to cocaine.  There is also a diagnosis of acute heart failure. Gael was hospitalized July 16, 2017: presented to the ER w/ 2 months of chest pain off and on, coincident w/ using cocaine.  In addition patient admitted to be having suicidal ideation due to family issues / pending divorce.  CLEOTHA TSANG was referred as part of his discharge planning from the initial stage of his hospitalization prior to going to behavioral health for substance abuse counseling. --He has now quit smoking and has been abstinent of alcohol and cocaine as well as other drugs.  Saw him initially on February 4.  He was evaluated with an echocardiogram and Myoview stress test to evaluate his chest discomfort.  Recent Hospitalizations:   None  Studies Personally Reviewed - (if available, images/films reviewed: From Epic Chart or Care Everywhere)  Transthoracic echo January 2019: Mild to moderate reduced EF 40-45% with diffuse hypokinesis.  Otherwise normal.  Myoview 09/13/17: Low risk stress nuclear study with probable prominent diaphragmatic attenuation; cannot R/O prior inferior MI; EF 53 with normal wall motion (normal wall motion seen on echo as well) The left ventricular ejection fraction is mildly decreased (45-54%). EF: 53%.  Blood pressure demonstrated a normal response to exercise. There was no ST segment deviation noted during stress.  Defect 1: There is a large defect of severe severity present in the basal inferior and mid inferior location.  This is a low risk  study.  Interval History: Holbert today still having intermittent episodes of chest discomfort, but for the most part is doing fairly well.  He never did take the Imdur because it gave him a headache.  He has not had any exertional chest discomfort, is usually sharp stabbing pains that occur at rest. The episodes are still short-lived and not necessarily associated with exertion.  They do seem to take his breath away.  He feels his heart rate going up, but n not irregular and not so fast that he becomes symptomatic.   He denies any PND, orthopnea or edema.  No rapid irregular heartbeats palpitations.  No syncope/near syncope or TIA/amaurosis fugax.  No claudication what he describes now sounds more like anxiety type symptoms where he is having a hard time sleeping halfway shelter.  He sometimes feels as though someone is coming to get him, or that he is not safe.  He is still noting some mild withdrawal symptoms as well.  ROS: A comprehensive was performed. Review of Systems  Constitutional: Positive for malaise/fatigue. Negative for weight loss (In fact he has gained quite a bit of weight having stopped smoking and cocaine).  HENT: Negative for congestion and nosebleeds.   Respiratory: Positive for cough (Chronic morning cough), shortness of breath (Per HPI) and wheezing. Negative for sputum production.   Cardiovascular:       Per HPI  Gastrointestinal: Positive for heartburn. Negative for abdominal pain, constipation and diarrhea.  Musculoskeletal: Positive for myalgias (Off and on in his calf, not necessarily associated with exertion.). Negative for back pain, falls and joint  pain.  Neurological: Negative for dizziness and weakness.  Psychiatric/Behavioral: Positive for substance abuse (Working on abstinence.). Negative for memory loss. The patient is nervous/anxious and has insomnia.   All other systems reviewed and are negative.  I have reviewed and (if needed) personally updated the patient's  problem list, medications, allergies, past medical and surgical history, social and family history.   Past Medical History:  Diagnosis Date  . Anxiety   . Chronic bronchitis (HCC)   . Chronic lower back pain   . COPD (chronic obstructive pulmonary disease) (HCC)   . History of blood transfusion 1984   related to "tonsillectomy" (07/17/2017)  . IBS (irritable bowel syndrome)     Past Surgical History:  Procedure Laterality Date  . EYE SURGERY Right    "fell out of crib when I was a baby; busted it open"  . HAND SURGERY Left 2000   "degloving injury; vein graft, skin graft"  . HERNIA REPAIR    . INGUINAL HERNIA REPAIR Left   . LUMBAR LAMINECTOMY    . NM MYOVIEW LTD     LOW RISK.  EF 50-55%.  Basal inferior mid inferior defect that appears to be fixed.  Cannot exclude MI versus diaphragmatic attenuation.  Normal wall motion would argue against prior infarct. --   . TONSILLECTOMY  1984  . TRANSTHORACIC ECHOCARDIOGRAM  07/2017   Mild to moderate reduced EF 40-45% with diffuse hypokinesis.  Otherwise normal.  . UMBILICAL HERNIA REPAIR      No outpatient medications have been marked as taking for the 09/20/17 encounter (Office Visit) with Marykay Lex, MD.    Allergies  Allergen Reactions  . Aspirin Rash    Social History   Tobacco Use  . Smoking status: Current Every Day Smoker    Packs/day: 1.00    Years: 29.00    Pack years: 29.00    Types: Cigarettes  . Smokeless tobacco: Never Used  Substance Use Topics  . Alcohol use: Yes    Alcohol/week: 50.4 oz    Types: 84 Cans of beer per week    Comment: 07/17/2017 "12 beers or more day "  . Drug use: Yes    Types: Cocaine, Marijuana   Social History   Social History Narrative   Recently discharged from behavioral health: Indicates that he has not had a drink or any cocaine/marijuana since discharge.  He is also trying to be abstinent of cigarettes, but having a hard time.     family history includes Esophageal cancer in  his father; Hypertension in his other.  Wt Readings from Last 3 Encounters:  09/20/17 190 lb 6.4 oz (86.4 kg)  09/13/17 186 lb (84.4 kg)  08/19/17 186 lb 9.6 oz (84.6 kg)    PHYSICAL EXAM BP (!) 142/78 (BP Location: Right Arm, Patient Position: Sitting, Cuff Size: Normal)   Pulse 93   Ht 5\' 10"  (1.778 m)   Wt 190 lb 6.4 oz (86.4 kg)   SpO2 99%   BMI 27.32 kg/m  Physical Exam  Constitutional: He is oriented to person, place, and time. He appears well-developed and well-nourished. No distress (Just very anxious).  He actually looks much more clean-cut today.  Better groomed.  Now looks more like his stated age.  HENT:  Head: Normocephalic and atraumatic.  Mouth/Throat: No oropharyngeal exudate.  Poor dentition  Eyes: EOM are normal. Pupils are equal, round, and reactive to light. No scleral icterus.  Neck: Normal range of motion. Neck supple. No JVD present.  Cardiovascular: Normal rate, regular rhythm, normal heart sounds and normal pulses.  Occasional extrasystoles are present. PMI is not displaced. Exam reveals no gallop and no friction rub.  No murmur heard. Pulmonary/Chest: Effort normal. No respiratory distress. He has no wheezes (Occasional diffuse, but less prominent). He has no rales. He exhibits tenderness (Upper sternal border).  Abdominal: Soft. Bowel sounds are normal. He exhibits no distension. There is no tenderness. There is no rebound.  Musculoskeletal: Normal range of motion. He exhibits no edema or deformity.  Neurological: He is alert and oriented to person, place, and time.  Skin: Skin is warm and dry.  Psychiatric: He has a normal mood and affect. His behavior is normal. Judgment and thought content normal.  Besides being a little anxious, he actually seem to be relatively stable  Nursing note and vitals reviewed.    Adult ECG Report -last evaluation was January 6 (not done today) Not checked   Other studies Reviewed: Additional studies/ records that were  reviewed today include:  Recent Labs:   Lab Results  Component Value Date   CREATININE 1.05 07/19/2017   BUN 12 07/19/2017   NA 134 (L) 07/19/2017   K 4.0 07/19/2017   CL 99 (L) 07/19/2017   CO2 27 07/19/2017   Lab Results  Component Value Date   CHOL 178 07/18/2017   HDL 41 07/18/2017   LDLCALC 105 (H) 07/18/2017   TRIG 160 (H) 07/18/2017   CHOLHDL 4.3 07/18/2017    ASSESSMENT / PLAN: Problem List Items Addressed This Visit    Essential hypertension (Chronic)    Blood pressure is borderline today.  With him having some fatigue symptoms on beta-blocker I will not titrate that further and will start amlodipine 2.5 mg daily.  This may help with there is some spasm related symptoms.      Relevant Medications   amLODipine (NORVASC) 2.5 MG tablet   carvedilol (COREG) 6.25 MG tablet   Dilated cardiomyopathy (HCC) (Chronic)    EF seems to be better on Myoview, indicating is roughly 50-55%.  I would imagine the  EF by echo is probably a more accurate.  Probably more consistent with EF of 45-50%.  They did not seem to be any regional wall motion abnormalities which would argue against the fixed defect being related to prior infarct as opposed to prior infarct.  For now would seem to treat blood pressure with carvedilol (he is having a little bit of fatigue so I would not want to further titrate.  We will add amlodipine 2.5 mg daily.      Relevant Medications   amLODipine (NORVASC) 2.5 MG tablet   carvedilol (COREG) 6.25 MG tablet   Chest pain with moderate risk for cardiac etiology - Primary    Nonischemic Myoview.  Did not tolerate Imdur.  We will convert to amlodipine 2.5 mg daily for possible coronary spasm. Myoview suggested fixed inferior defect which is probably artifactual and not true infarct.  Echo did not show inferior defect         He has quit just about every other bites, so he is not quite yet ready to quit smoking.  Current medicines are reviewed at length with  the patient today. (+/- concerns) n/a The following changes have been made: see below  Patient Instructions  Medication Instructions:  START amlodipine 2.5 mg daily at bedtime.  Follow-Up: Your physician wants you to follow-up in: 6 months with Dr. Herbie Baltimore.  You will receive a reminder letter in the  mail two months in advance. If you don't receive a letter, please call our office to schedule the follow-up appointment.     If you need a refill on your cardiac medications before your next appointment, please call your pharmacy.     Studies Ordered:   No orders of the defined types were placed in this encounter.     Bryan Lemma, M.D., M.S. Interventional Cardiologist   Pager # [phone] Phone # [phone] [address] [place], [place] [address]   Thank you for choosing Heartcare at [facility]!!

## 2017-09-20 NOTE — Patient Instructions (Signed)
Medication Instructions:  START amlodipine 2.5 mg daily at bedtime.  Follow-Up: Your physician wants you to follow-up in: 6 months with Dr. Herbie Baltimore.  You will receive a reminder letter in the mail two months in advance. If you don't receive a letter, please call our office to schedule the follow-up appointment.     If you need a refill on your cardiac medications before your next appointment, please call your pharmacy.

## 2017-09-22 ENCOUNTER — Encounter: Payer: Self-pay | Admitting: Cardiology

## 2017-09-22 NOTE — Assessment & Plan Note (Signed)
Nonischemic Myoview.  Did not tolerate Imdur.  We will convert to amlodipine 2.5 mg daily for possible coronary spasm. Myoview suggested fixed inferior defect which is probably artifactual and not true infarct.  Echo did not show inferior defect

## 2017-09-22 NOTE — Assessment & Plan Note (Signed)
EF seems to be better on Myoview, indicating is roughly 50-55%.  I would imagine the  EF by echo is probably a more accurate.  Probably more consistent with EF of 45-50%.  They did not seem to be any regional wall motion abnormalities which would argue against the fixed defect being related to prior infarct as opposed to prior infarct.  For now would seem to treat blood pressure with carvedilol (he is having a little bit of fatigue so I would not want to further titrate.  We will add amlodipine 2.5 mg daily.

## 2017-09-22 NOTE — Assessment & Plan Note (Signed)
Blood pressure is borderline today.  With him having some fatigue symptoms on beta-blocker I will not titrate that further and will start amlodipine 2.5 mg daily.  This may help with there is some spasm related symptoms.

## 2017-11-06 DIAGNOSIS — F4323 Adjustment disorder with mixed anxiety and depressed mood: Secondary | ICD-10-CM | POA: Diagnosis not present

## 2017-11-13 DIAGNOSIS — F4323 Adjustment disorder with mixed anxiety and depressed mood: Secondary | ICD-10-CM | POA: Diagnosis not present

## 2018-09-18 IMAGING — NM NM MISC PROCEDURE
9 series · 54 of 54 positions shown · non-contrast
Comparison: none

[Series 1: rest sax · 6.4mm · 6.40mm/px · 6 of 23 frames shown]
[frame 2/23]
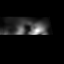
[frame 6/23]
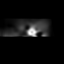
[frame 10/23]
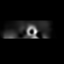
[frame 14/23]
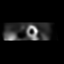
[frame 18/23]
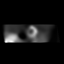
[frame 22/23]
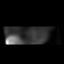

[Series 1: wbr rest · 6.40mm/px · 6 of 64 frames shown]
[frame 6/64]
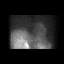
[frame 16/64]
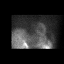
[frame 27/64]
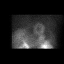
[frame 38/64]
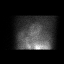
[frame 48/64]
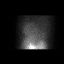
[frame 59/64]
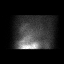

[Series 1: wbr_r-proj_st wbr rest · 6.40mm/px · 6 of 64 frames shown]
[frame 6/64]
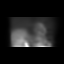
[frame 16/64]
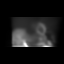
[frame 27/64]
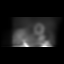
[frame 38/64]
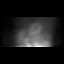
[frame 48/64]
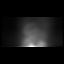
[frame 59/64]
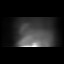

[Series 2: stress sax gs · 6.4mm · 6.40mm/px · 6 of 200 frames shown]
[frame 17/200]
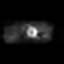
[frame 50/200]
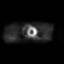
[frame 84/200]
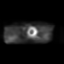
[frame 117/200]
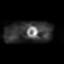
[frame 150/200]
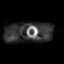
[frame 184/200]
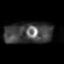

[Series 2: wbr_s-proj_st wbr stress-gsp · 6.40mm/px · 6 of 512 frames shown]
[frame 43/512]
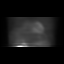
[frame 128/512]
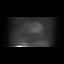
[frame 214/512]
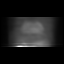
[frame 299/512]
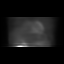
[frame 384/512]
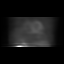
[frame 470/512]
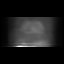

[Series 2: wbr stress-gsp · 6.40mm/px · 6 of 512 frames shown]
[frame 43/512]
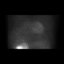
[frame 128/512]
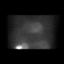
[frame 214/512]
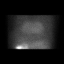
[frame 299/512]
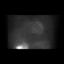
[frame 384/512]
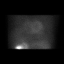
[frame 470/512]
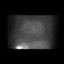

[Series 3: stress sax · 6.4mm · 6.40mm/px · 6 of 25 frames shown]
[frame 3/25]
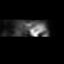
[frame 7/25]
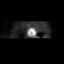
[frame 11/25]
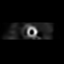
[frame 15/25]
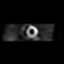
[frame 19/25]
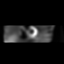
[frame 23/25]
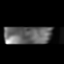

[Series 3: wbr_s-proj_st wbr stress-sum-em · 6.40mm/px · 6 of 64 frames shown]
[frame 6/64]
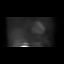
[frame 16/64]
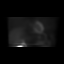
[frame 27/64]
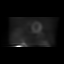
[frame 38/64]
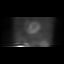
[frame 48/64]
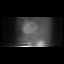
[frame 59/64]
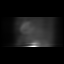

[Series 3: wbr stress-sum-em · 6.40mm/px · 6 of 64 frames shown]
[frame 6/64]
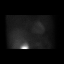
[frame 16/64]
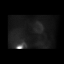
[frame 27/64]
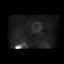
[frame 38/64]
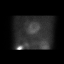
[frame 48/64]
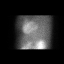
[frame 59/64]
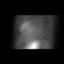

[54 of 54 positions shown; findings below may reference images not displayed]

Canned report from images found in remote index.

Refer to host system for actual result text.

## 2019-02-09 ENCOUNTER — Encounter (HOSPITAL_BASED_OUTPATIENT_CLINIC_OR_DEPARTMENT_OTHER): Payer: Self-pay | Admitting: *Deleted

## 2019-02-09 ENCOUNTER — Other Ambulatory Visit: Payer: Self-pay

## 2019-02-09 ENCOUNTER — Emergency Department (HOSPITAL_BASED_OUTPATIENT_CLINIC_OR_DEPARTMENT_OTHER)
Admission: EM | Admit: 2019-02-09 | Discharge: 2019-02-09 | Disposition: A | Discharge status: Home / Self Care | Payer: BC Managed Care – PPO | Attending: Emergency Medicine | Admitting: Emergency Medicine

## 2019-02-09 DIAGNOSIS — R1032 Left lower quadrant pain: Secondary | ICD-10-CM | POA: Diagnosis not present

## 2019-02-09 DIAGNOSIS — Z79899 Other long term (current) drug therapy: Secondary | ICD-10-CM | POA: Diagnosis not present

## 2019-02-09 DIAGNOSIS — J449 Chronic obstructive pulmonary disease, unspecified: Secondary | ICD-10-CM | POA: Diagnosis not present

## 2019-02-09 DIAGNOSIS — F17210 Nicotine dependence, cigarettes, uncomplicated: Secondary | ICD-10-CM | POA: Diagnosis not present

## 2019-02-09 DIAGNOSIS — Z8719 Personal history of other diseases of the digestive system: Secondary | ICD-10-CM | POA: Insufficient documentation

## 2019-02-09 DIAGNOSIS — I10 Essential (primary) hypertension: Secondary | ICD-10-CM | POA: Diagnosis not present

## 2019-02-09 DIAGNOSIS — R109 Unspecified abdominal pain: Secondary | ICD-10-CM | POA: Insufficient documentation

## 2019-02-09 LAB — CBC WITH DIFFERENTIAL/PLATELET
Abs Immature Granulocytes: 0.09 10*3/uL — ABNORMAL HIGH (ref 0.00–0.07)
Basophils Absolute: 0 10*3/uL (ref 0.0–0.1)
Basophils Relative: 0 %
Eosinophils Absolute: 0.2 10*3/uL (ref 0.0–0.5)
Eosinophils Relative: 2 %
HCT: 45.4 % (ref 39.0–52.0)
Hemoglobin: 15.1 g/dL (ref 13.0–17.0)
Immature Granulocytes: 1 %
Lymphocytes Relative: 25 %
Lymphs Abs: 2.2 10*3/uL (ref 0.7–4.0)
MCH: 28.7 pg (ref 26.0–34.0)
MCHC: 33.3 g/dL (ref 30.0–36.0)
MCV: 86.3 fL (ref 80.0–100.0)
Monocytes Absolute: 0.7 10*3/uL (ref 0.1–1.0)
Monocytes Relative: 9 %
Neutro Abs: 5.4 10*3/uL (ref 1.7–7.7)
Neutrophils Relative %: 63 %
Platelets: 242 10*3/uL (ref 150–400)
RBC: 5.26 MIL/uL (ref 4.22–5.81)
RDW: 12.2 % (ref 11.5–15.5)
WBC: 8.6 10*3/uL (ref 4.0–10.5)
nRBC: 0 % (ref 0.0–0.2)

## 2019-02-09 LAB — COMPREHENSIVE METABOLIC PANEL
ALT: 33 U/L (ref 0–44)
AST: 24 U/L (ref 15–41)
Albumin: 4.3 g/dL (ref 3.5–5.0)
Alkaline Phosphatase: 61 U/L (ref 38–126)
Anion gap: 10 (ref 5–15)
BUN: 17 mg/dL (ref 6–20)
CO2: 26 mmol/L (ref 22–32)
Calcium: 9 mg/dL (ref 8.9–10.3)
Chloride: 101 mmol/L (ref 98–111)
Creatinine, Ser: 0.96 mg/dL (ref 0.61–1.24)
GFR calc Af Amer: >60 mL/min (ref >60)
GFR calc non Af Amer: >60 mL/min (ref >60)
Glucose, Bld: 95 mg/dL (ref 70–99)
Potassium: 5.2 mmol/L — ABNORMAL HIGH (ref 3.5–5.1)
Sodium: 137 mmol/L (ref 135–145)
Total Bilirubin: 0.6 mg/dL (ref 0.3–1.2)
Total Protein: 7.1 g/dL (ref 6.5–8.1)

## 2019-02-09 LAB — URINALYSIS, ROUTINE W REFLEX MICROSCOPIC
Bilirubin Urine: NEGATIVE
Glucose, UA: NEGATIVE mg/dL
Hgb urine dipstick: NEGATIVE
Ketones, ur: NEGATIVE mg/dL
Leukocytes,Ua: NEGATIVE
Nitrite: NEGATIVE
Protein, ur: NEGATIVE mg/dL
Specific Gravity, Urine: 1.015 (ref 1.005–1.030)
pH: 7 (ref 5.0–8.0)

## 2019-02-09 LAB — CBG MONITORING, ED: Glucose-Capillary: 92 mg/dL (ref 70–99)

## 2019-02-09 LAB — LIPASE, BLOOD: Lipase: 23 U/L (ref 11–51)

## 2019-02-09 NOTE — ED Notes (Signed)
Pt. Has had history of IBS and a fissure in past.;

## 2019-02-09 NOTE — ED Provider Notes (Signed)
[facility] EMERGENCY DEPARTMENT Provider Note   CSN: [id number] Arrival date & time: 02/09/19  1505    History   Chief Complaint Chief Complaint  Patient presents with  . Abdominal Pain    HPI Jared Robinson is a 48 y.o. male.     HPI   Pt is a 48 y/o male with a h/o IBS, COPD, chronic lower back pain, dilated cardiomyopathy, who presents to the ED today c/o left flank pain and left lower abd pain that has been intermittent for the last several months. Pain currently rated as 0/10. Pain occurs intermittently.  When it occurs he describes it as dull and mild. It occurs several times per day and last for several minutes up to an hour. Denies associated nausea, vomiting. States that his stools have been "flat". States that is looks like a ribbon instead of being round. This has been ongoing for about 1 year. States he has had urinary frequency. He has intermittent dysuria as well. He reports an abnormal odor to his GU area. Denies penile discharge or hematuria.  He states he has not been sexually active for 1.5 years.   Past Medical History:  Diagnosis Date  . Anxiety   . Chronic bronchitis (HCC)   . Chronic lower back pain   . COPD (chronic obstructive pulmonary disease) (HCC)   . History of blood transfusion 1984   related to "tonsillectomy" (07/17/2017)  . IBS (irritable bowel syndrome)     Patient Active Problem List   Diagnosis Date Noted  . Essential hypertension 09/20/2017  . Insomnia 09/20/2017  . Dilated cardiomyopathy (HCC) 08/19/2017  . MDD (major depressive disorder), severe (HCC) 07/19/2017  . 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 Chest pain with moderate risk for cardiac etiology 07/16/2017  . Polysubstance abuse (HCC) 07/16/2017  . Suicidal behavior 07/16/2017    Past Surgical History:  Procedure Laterality Date  . EYE SURGERY Right    "fell out of crib when I was a baby; busted it open"  . HAND SURGERY Left  2000   "degloving injury; vein graft, skin graft"  . HERNIA REPAIR    . INGUINAL HERNIA REPAIR Left   . LUMBAR LAMINECTOMY    . NM MYOVIEW LTD     LOW RISK.  EF 50-55%.  Basal inferior mid inferior defect that appears to be fixed.  Cannot exclude MI versus diaphragmatic attenuation.  Normal wall motion would argue against prior infarct. --   . TONSILLECTOMY  1984  . TRANSTHORACIC ECHOCARDIOGRAM  07/2017   Mild to moderate reduced EF 40-45% with diffuse hypokinesis.  Otherwise normal.  . UMBILICAL HERNIA REPAIR          Home Medications    Prior to Admission medications   Medication Sig Start Date End Date Taking? Authorizing Provider  amLODipine (NORVASC) 2.5 MG tablet Take 1 tablet (2.5 mg total) by mouth daily. 09/20/17 12/19/17  Marykay LexHarding, David W, MD  carvedilol (COREG) 6.25 MG tablet Take 1 tablet (6.25 mg total) by mouth 2 (two) times daily. 09/20/17   Marykay LexHarding, David W, MD  nitroGLYCERIN (NITROSTAT) 0.4 MG SL tablet Place 1 tablet (0.4 mg total) under the tongue every 5 (five) minutes as needed for chest pain (CP or SOB). Patient not taking: Reported on 09/20/2017 07/25/17   Money, Gerlene Burdockravis B, FNP  traZODone (DESYREL) 50 MG tablet Take 1 tablet (50 mg total) by mouth at bedtime as needed for sleep. 09/20/17   Herbie BaltimoreHarding,  Leonie Green, MD    Family History Family History  Problem Relation Age of Onset  . Hypertension Other   . Esophageal cancer Father     Social History Social History   Tobacco Use  . Smoking status: Current Every Day Smoker    Packs/day: 1.00    Years: 29.00    Pack years: 29.00    Types: Cigarettes  . Smokeless tobacco: Never Used  Substance Use Topics  . Alcohol use: Yes    Alcohol/week: 84.0 standard drinks    Types: 84 Cans of beer per week    Comment: 07/17/2017 "12 beers or more day "  . Drug use: Yes    Types: Cocaine, Marijuana     Allergies   Aspirin   Review of Systems Review of Systems  Constitutional: Negative for fever.  HENT: Negative for ear  pain and sore throat.   Eyes: Negative for visual disturbance.  Respiratory: Negative for cough and shortness of breath.   Cardiovascular: Negative for chest pain.  Gastrointestinal: Positive for abdominal pain (resolved). Negative for blood in stool, constipation, diarrhea, nausea and vomiting.  Genitourinary: Positive for dysuria, flank pain and frequency. Negative for discharge, hematuria and urgency.  Musculoskeletal: Positive for back pain (left lower back).  Skin: Negative for color change and rash.  Neurological: Negative for headaches.  All other systems reviewed and are negative.    Physical Exam Updated Vital Signs BP 123/67 (BP Location: Left Arm)   Pulse 76   Temp 98.6 F (37 C) (Oral)   Resp 12   Ht 5\' 10"  (1.778 m)   Wt 93 kg   SpO2 99%   BMI 29.41 kg/m   Physical Exam Vitals signs and nursing note reviewed.  Constitutional:      General: He is not in acute distress.    Appearance: He is well-developed. He is not ill-appearing or toxic-appearing.  HENT:     Head: Normocephalic and atraumatic.  Eyes:     Conjunctiva/sclera: Conjunctivae normal.  Neck:     Musculoskeletal: Neck supple.  Cardiovascular:     Rate and Rhythm: Normal rate and regular rhythm.     Heart sounds: Normal heart sounds. No murmur.  Pulmonary:     Effort: Pulmonary effort is normal. No respiratory distress.     Breath sounds: Normal breath sounds. No wheezing, rhonchi or rales.  Abdominal:     General: Bowel sounds are normal.     Palpations: Abdomen is soft.     Tenderness: There is abdominal tenderness (mild LLQ). There is no right CVA tenderness, left CVA tenderness, guarding or rebound.     Hernia: No hernia is present.  Skin:    General: Skin is warm and dry.  Neurological:     Mental Status: He is alert.    ED Treatments / Results  Labs (all labs ordered are listed, but only abnormal results are displayed) Labs Reviewed  CBC WITH DIFFERENTIAL/PLATELET - Abnormal;  Notable for the following components:      Result Value   Abs Immature Granulocytes 0.09 (*)    All other components within normal limits  COMPREHENSIVE METABOLIC PANEL - Abnormal; Notable for the following components:   Potassium 5.2 (*)    All other components within normal limits  URINALYSIS, ROUTINE W REFLEX MICROSCOPIC  LIPASE, BLOOD  CBG MONITORING, ED    EKG None  Radiology No results found.  Procedures Procedures (including critical care time)  Medications Ordered in ED Medications - No data  to display   Initial Impression / Assessment and Plan / ED Course  I have reviewed the triage vital signs and the nursing notes.  Pertinent labs & imaging results that were available during my care of the patient were reviewed by me and considered in my medical decision making (see chart for details).     Final Clinical Impressions(s) / ED Diagnoses   Final diagnoses:  Abdominal pain, unspecified abdominal location   Patient is nontoxic, nonseptic appearing, in no apparent distress.  Patient denies any pain at the time of examination. Labs and vitals reviewed.  UA does not show any evidence of urinary tract infection or hematuria, making nephrolithiasis less likely.  CBC is without leukocytosis.  No anemia.  CMP with mild hyperkalemia at 5.2, otherwise normal renal and liver function.  Lipase negative. patient does not meet the SIRS or Sepsis criteria.  On repeat exam patient does not have a surgical abdomin and there are no peritoneal signs.  No indication of appendicitis, bowel obstruction, bowel perforation, cholecystitis, diverticulitis. Patient discharged home with symptomatic treatment and given strict instructions for follow-up with their primary care physician.  I have also discussed reasons to return immediately to the ER.  Patient expresses understanding and agrees with plan.  ED Discharge Orders    None       Rayne Du 02/09/19 1815    Vanetta Mulders, MD 02/12/19 1531

## 2019-02-09 NOTE — ED Triage Notes (Signed)
2 months of lower left quad pain and flank pain. He wakes at night from sleep with nausea and diaphoresis. Dysuria at times with odor. States he is not sexually active.

## 2019-02-09 NOTE — Discharge Instructions (Signed)

## 2019-09-18 ENCOUNTER — Encounter: Payer: Self-pay | Admitting: General Practice

## 2020-05-17 ENCOUNTER — Other Ambulatory Visit: Payer: Self-pay | Admitting: Internal Medicine

## 2020-05-17 DIAGNOSIS — R109 Unspecified abdominal pain: Secondary | ICD-10-CM

## 2020-05-17 DIAGNOSIS — R1011 Right upper quadrant pain: Secondary | ICD-10-CM

## 2020-05-25 ENCOUNTER — Ambulatory Visit
Admission: RE | Admit: 2020-05-25 | Discharge: 2020-05-25 | Disposition: A | Discharge status: Home / Self Care | Payer: BC Managed Care – PPO | Source: Ambulatory Visit | Attending: Internal Medicine | Admitting: Internal Medicine

## 2020-05-25 DIAGNOSIS — R109 Unspecified abdominal pain: Secondary | ICD-10-CM

## 2020-05-25 DIAGNOSIS — R1011 Right upper quadrant pain: Secondary | ICD-10-CM

## 2020-06-03 ENCOUNTER — Telehealth: Payer: Self-pay

## 2020-06-03 NOTE — Telephone Encounter (Signed)
Spoke with Camile from [facility]. Advised Patient has appointment with [facility] on 07/06/20 for Clearance

## 2020-06-03 NOTE — Telephone Encounter (Signed)
   [facility] Pre-operative Risk Assessment    HEARTCARE STAFF: - Please ensure there is not already an duplicate clearance open for this procedure. - Under Visit Info/Reason for Call, type in Other and utilize the format Clearance MM/DD/YY or Clearance TBD. Do not use dashes or single digits. - If request is for dental extraction, please clarify the # of teeth to be extracted.  Request for surgical clearance:  1. What type of surgery is being performed? Cholecystectomy   2. When is this surgery scheduled? TBD   3. What type of clearance is required (medical clearance vs. Pharmacy clearance to hold med vs. Both)? Medical clearance  4. Are there any medications that need to be held prior to surgery and how long?None    5. Practice name and name of physician performing surgery? [facility], Earnstine Regal MD   6. What is the office phone number? [phone]   7.   What is the office fax number? [phone]  8.   Anesthesia type (None, local, MAC, general) ? General   Monia Pouch 06/03/2020, 10:15 AM  _________________________________________________________________   (provider comments below)

## 2020-06-03 NOTE — Telephone Encounter (Signed)
   Primary Cardiologist: Bryan Lemma, MD - last seen in 2019  Chart reviewed as part of pre-operative protocol coverage. Because of Jared Robinson past medical history and time since last visit, he will require a follow-up visit in order to better assess preoperative cardiovascular risk.  Pre-op covering staff: - Please schedule appointment and call patient to inform them.Please add "pre-op clearance" to the appointment notes so provider is aware. - Please contact requesting surgeon's office via preferred method (i.e, phone, fax) to inform them of need for appointment prior to surgery.   Beatriz Stallion, PA-C  06/03/2020, 10:34 AM

## 2020-06-03 NOTE — Telephone Encounter (Signed)
Called patient to advise appointment schedule H. Meng 12/22 for Pre op clearance. Unable to make contact with patient left message to please call office.

## 2020-06-06 NOTE — Telephone Encounter (Signed)
appt 07/06/20 with Azalee Course, PAC for pre op clearance. Will send FYI to requesting office. Will forward to [facility] for upcoming appt. Will remove from the pre op call back pool.

## 2020-07-06 ENCOUNTER — Ambulatory Visit: Payer: Self-pay | Admitting: Physician Assistant

## 2021-12-12 ENCOUNTER — Ambulatory Visit: Payer: Self-pay | Admitting: Surgery

## 2021-12-14 ENCOUNTER — Telehealth: Payer: Self-pay

## 2021-12-14 NOTE — Telephone Encounter (Signed)
   Pre-operative Risk Assessment    Patient Name: NICKY KRAS  DOB: September 10, 1970 MRN: [id number]      Request for Surgical Clearance    Procedure:   Gallbladder Surgery  Date of Surgery:  Clearance TBD                                 Surgeon:  Dr Harriette Bouillon Surgeon's Group or Practice Name:  [facility] Phone number:  [phone] Fax number:  [phone]   Type of Clearance Requested:     be used for the procedure.  :1}  Type of Anesthesia:  General    Additional requests/questions:  Please call to advise if this patient will require an office visit or further medical work up before clearance can be given.  Signed, Tera Helper Raj Landress   12/14/2021, 1:36 PM

## 2021-12-14 NOTE — Telephone Encounter (Signed)
   Name: Jared Robinson  DOB: 09/11/1970  MRN: [id number]  Primary Cardiologist: Bryan Lemma, MD  Chart reviewed as part of pre-operative protocol coverage. Because of Jared Robinson past medical history and time since last visit, he will require a follow-up in-office visit in order to better assess preoperative cardiovascular risk.  Pre-op covering staff: - Please schedule appointment and call patient to inform them. If patient already had an upcoming appointment within acceptable timeframe, please add "pre-op clearance" to the appointment notes so provider is aware. - Please contact requesting surgeon's office via preferred method (i.e, phone, fax) to inform them of need for appointment prior to surgery.  There was a previous cardiac clearance request made in November 2021 at which time it was recommended for the patient to be seen in the clinic before clearance, however the appointment was canceled as the patient was out of town.  He was supposed to reschedule but never did it.  Now we have received a new cardiac clearance for a different procedure, again he will need to be seen in the clinic prior to final cardiac clearance.  He was last seen by Dr. Herbie Baltimore in March 2019.  [place], [place]  12/14/2021, 11:23 PM

## 2021-12-15 NOTE — Telephone Encounter (Signed)
1st attempt to reach pt regarding surgical clearance and the need for an in-office appointment. Left pt a message to call back and schedule.

## 2021-12-18 NOTE — Telephone Encounter (Signed)
Pt last seen 2019 by cardiology and will need a NEW PT APPT. I will send FYI to requesting office as we may need to have a referral sent over as well. I will send a note to our chart prep team as well.   I tried to call the pt though no answer.

## 2021-12-19 ENCOUNTER — Other Ambulatory Visit: Payer: Self-pay | Admitting: Surgery

## 2021-12-19 DIAGNOSIS — K8020 Calculus of gallbladder without cholecystitis without obstruction: Secondary | ICD-10-CM

## 2021-12-21 NOTE — Telephone Encounter (Signed)
Pt has NEW PT APP 12/29/21 with Dr. Ellyn Hack. Will send FYI to requesting office the pt has a new pt appt 12/29/21. Once we have cleared the pt Dr. Ellyn Hack will have his nurse fax over clearance notes as well as any medication recommendations.

## 2021-12-29 ENCOUNTER — Ambulatory Visit: Payer: Commercial Managed Care - HMO | Admitting: Cardiology

## 2021-12-29 VITALS — BP 131/84 | HR 93 | Ht 70.0 in | Wt 187.2 lb

## 2021-12-29 DIAGNOSIS — R9439 Abnormal result of other cardiovascular function study: Secondary | ICD-10-CM | POA: Diagnosis not present

## 2021-12-29 DIAGNOSIS — R072 Precordial pain: Secondary | ICD-10-CM | POA: Diagnosis not present

## 2021-12-29 DIAGNOSIS — I10 Essential (primary) hypertension: Secondary | ICD-10-CM | POA: Diagnosis not present

## 2021-12-29 DIAGNOSIS — R0609 Other forms of dyspnea: Secondary | ICD-10-CM | POA: Diagnosis not present

## 2021-12-29 DIAGNOSIS — Z01810 Encounter for preprocedural cardiovascular examination: Secondary | ICD-10-CM | POA: Diagnosis not present

## 2021-12-29 DIAGNOSIS — I420 Dilated cardiomyopathy: Secondary | ICD-10-CM

## 2021-12-29 MED ORDER — CARVEDILOL 3.125 MG PO TABS: 3.1250 mg | ORAL_TABLET | Freq: Two times a day (BID) | ORAL | 3 refills | Status: DC

## 2021-12-29 MED ORDER — METOPROLOL TARTRATE 50 MG PO TABS: 100.0000 mg | ORAL_TABLET | Freq: Once | ORAL | 0 refills | Status: DC

## 2021-12-29 NOTE — Patient Instructions (Addendum)
Medication Instructions:   Start  carvedilol 3.125 mg twice a day   *If you need a refill on your cardiac medications before your next appointment, please call your pharmacy*   Lab Work: BMP If you have labs (blood work) drawn today and your tests are completely normal, you will receive your results only by: MyChart Message (if you have MyChart) OR A paper copy in the mail If you have any lab test that is abnormal or we need to change your treatment, we will call you to review the results.   Testing/Procedures:  Will be schedule Your physician has requested that you have an echocardiogram. Echocardiography is a painless test that uses sound waves to create images of your heart. It provides your doctor with information about the size and shape of your heart and how well your heart's chambers and valves are working. This procedure takes approximately one hour. There are no restrictions for this procedure.     Your physician has requested that you have coronary CTA. Cardiac computed tomography (CT) is a painless test that uses an x-ray machine to take clear, detailed pictures of your heart. For further information please visit [email or web address] Please follow instruction sheet as given.    Follow-Up: At [facility], you and your health needs are our priority.  As part of our continuing mission to provide you with exceptional heart care, we have created designated Provider Care Teams.  These Care Teams include your primary Cardiologist (physician) and Advanced Practice Providers (APPs -  Physician Assistants and Nurse Practitioners) who all work together to provide you with the care you need, when you need it.     Your next appointment:   3 month(s)  The format for your next appointment:   In Person  Provider:   Bryan Lemma, MD    Other Instructions  If testing is normal you will be cleared for surgery   Your cardiac CT will be scheduled at the below location:   [facility] [address] [place], [place] [address] [phone]    If scheduled at [facility], please arrive at the [facility] and Children's Entrance (Entrance C2) of [facility] 30 minutes prior to test start time. You can use the FREE valet parking offered at entrance C (encouraged to control the heart rate for the test)  Proceed to the [facility] Radiology Department (first floor) to check-in and test prep.  All radiology patients and guests should use entrance C2 at [facility], accessed from [facility], even though the hospital's physical address listed is [address].      Please follow these instructions carefully (unless otherwise directed):  Hold all erectile dysfunction medications at least 3 days (72 hrs) prior to test.  Bmp today    On the Night Before the Test: Be sure to Drink plenty of water. Do not consume any caffeinated/decaffeinated beverages or chocolate 12 hours prior to your test. Do not take any antihistamines 12 hours prior to your test.   On the Day of the Test: Drink plenty of water until 1 hour prior to the test. Do not eat any food 4 hours prior to the test. You may take your regular medications prior to the test.  Take metoprolol (Lopressor) 100 mg two hours prior to test.          After the Test: Drink plenty of water. After receiving IV contrast, you may experience a mild flushed feeling. This  is normal. On occasion, you may experience a mild rash up to 24 hours after the test. This is not dangerous. If this occurs, you can take Benadryl 25 mg and increase your fluid intake. If you experience trouble breathing, this can be serious. If it is severe call 911 IMMEDIATELY. If it is mild, please call our office.   We will call to schedule your test 2-4 weeks out understanding that some insurance companies will need an authorization prior to the service being performed.   For  non-scheduling related questions, please contact the cardiac imaging nurse navigator should you have any questions/concerns: Rockwell Alexandria, Cardiac Imaging Nurse Navigator Larey Brick, Cardiac Imaging Nurse Navigator [facility] Direct Office Dial: [phone]   For scheduling needs, including cancellations and rescheduling, please call [place], [phone].

## 2021-12-29 NOTE — Progress Notes (Unsigned)
Primary Care Provider: Burton Apley, MD Cardiologist: Bryan Lemma, MD Electrophysiologist: None  Clinic Note: No chief complaint on file.   ===================================  ASSESSMENT/PLAN   Problem List Items Addressed This Visit       Cardiology Problems   Dilated cardiomyopathy (HCC) (Chronic)   Essential hypertension - Primary (Chronic)     Other   Precordial pain (Chronic)   Dyspnea on exertion   Preop cardiovascular exam   Abnormal nuclear stress test    ===================================  HPI:    Jared Robinson is a 51 y.o. male with a PMH below who presents today for ***. Jared Robinson is a 51 y.o. male who is being seen today for the evaluation of *** at the request of Cornett, Maisie Fus, MD.  Jared Robinson was last seen on ***  Recent Hospitalizations: ***  Reviewed  CV studies:    The following studies were reviewed today: (if available, images/films reviewed: From Epic Chart or Care Everywhere) ***:   Interval History:   Jared Robinson   CV Review of Symptoms (Summary) Cardiovascular ROS: {roscv:310661}  REVIEWED OF SYSTEMS   ROS  I have reviewed and (if needed) personally updated the patient's problem list, medications, allergies, past medical and surgical history, social and family history.   PAST MEDICAL HISTORY   Past Medical History:  Diagnosis Date   Anxiety    Chronic bronchitis (HCC)    Chronic lower back pain    COPD (chronic obstructive pulmonary disease) (HCC)    History of blood transfusion 1984   related to "tonsillectomy" (07/17/2017)   IBS (irritable bowel syndrome)     PAST SURGICAL HISTORY   Past Surgical History:  Procedure Laterality Date   EYE SURGERY Right    "fell out of crib when I was a baby; busted it open"   HAND SURGERY Left 2000   "degloving injury; vein graft, skin graft"   HERNIA REPAIR     INGUINAL HERNIA REPAIR Left    LUMBAR LAMINECTOMY     NM MYOVIEW LTD     LOW RISK.  EF  50-55%.  Basal inferior mid inferior defect that appears to be fixed.  Cannot exclude MI versus diaphragmatic attenuation.  Normal wall motion would argue against prior infarct. --    TONSILLECTOMY  1984   TRANSTHORACIC ECHOCARDIOGRAM  07/2017   Mild to moderate reduced EF 40-45% with diffuse hypokinesis.  Otherwise normal.   UMBILICAL HERNIA REPAIR      There is no immunization history on file for this patient.  MEDICATIONS/ALLERGIES   No outpatient medications have been marked as taking for the 12/29/21 encounter (Office Visit) with Marykay Lex, MD.    Allergies  Allergen Reactions   Aspirin Rash    SOCIAL HISTORY/FAMILY HISTORY   Reviewed in Epic:  Pertinent findings:  Social History   Tobacco Use   Smoking status: Every Day    Packs/day: 1.00    Years: 29.00    Total pack years: 29.00    Types: Cigarettes   Smokeless tobacco: Never  Vaping Use   Vaping Use: Never used  Substance Use Topics   Alcohol use: Yes    Alcohol/week: 84.0 standard drinks of alcohol    Types: 84 Cans of beer per week    Comment: 07/17/2017 "12 beers or more day "   Drug use: Yes    Types: Cocaine, Marijuana   Social History   Social History Narrative   Recently discharged from behavioral health: Indicates  that he has not had a drink or any cocaine/marijuana since discharge.  He is also trying to be abstinent of cigarettes, but having a hard time.    OBJCTIVE -PE, EKG, labs   Wt Readings from Last 3 Encounters:  12/29/21 187 lb 3.2 oz (84.9 kg)  02/09/19 205 lb (93 kg)  09/20/17 190 lb 6.4 oz (86.4 kg)    Physical Exam: BP 131/84   Pulse 93   Ht 5\' 10"  (1.778 m)   Wt 187 lb 3.2 oz (84.9 kg)   SpO2 99%   BMI 26.86 kg/m  Physical Exam   Adult ECG Report  Rate: *** ;  Rhythm: {rhythm:17366};   Narrative Interpretation: ***  Recent Labs:  ***  Lab Results  Component Value Date   CHOL 178 07/18/2017   HDL 41 07/18/2017   LDLCALC 105 (H) 07/18/2017   TRIG 160 (H)  07/18/2017   CHOLHDL 4.3 07/18/2017   Lab Results  Component Value Date   CREATININE 0.96 02/09/2019   BUN 17 02/09/2019   NA 137 02/09/2019   K 5.2 (H) 02/09/2019   CL 101 02/09/2019   CO2 26 02/09/2019      Latest Ref Rng & Units 02/09/2019    4:45 PM 07/18/2017    4:41 AM 07/17/2017    3:38 AM  CBC  WBC 4.0 - 10.5 K/uL 8.6  7.1  6.6   Hemoglobin 13.0 - 17.0 g/dL 09/14/2017  45.8  09.9   Hematocrit 39.0 - 52.0 % 45.4  50.5  46.3   Platelets 150 - 400 K/uL 242  263  254     Lab Results  Component Value Date   HGBA1C 5.0 07/18/2017   Lab Results  Component Value Date   TSH 3.954 07/16/2017    ==================================================  COVID-19 Education: The signs and symptoms of COVID-19 were discussed with the patient and how to seek care for testing (follow up with PCP or arrange E-visit).    I spent a total of ***minutes with the patient spent in direct patient consultation.  Additional time spent with chart review  / charting (studies, outside notes, etc): *** min Total Time: *** min  Current medicines are reviewed at length with the patient today.  (+/- concerns) ***  This visit occurred during the SARS-CoV-2 public health emergency.  Safety protocols were in place, including screening questions prior to the visit, additional usage of staff PPE, and extensive cleaning of exam room while observing appropriate contact time as indicated for disinfecting solutions.  Notice: This dictation was prepared with Dragon dictation along with smart phrase technology. Any transcriptional errors that result from this process are unintentional and may not be corrected upon review.  Studies Ordered:   No orders of the defined types were placed in this encounter.  No orders of the defined types were placed in this encounter.   Patient Instructions / Medication Changes & Studies & Tests Ordered   Patient Instructions  Medication Instructions:    *If you need a refill on  your cardiac medications before your next appointment, please call your pharmacy*   Lab Work:  If you have labs (blood work) drawn today and your tests are completely normal, you will receive your results only by: MyChart Message (if you have MyChart) OR A paper copy in the mail If you have any lab test that is abnormal or we need to change your treatment, we will call you to review the results.   Testing/Procedures:  Will be  schedule Your physician has requested that you have an echocardiogram. Echocardiography is a painless test that uses sound waves to create images of your heart. It provides your doctor with information about the size and shape of your heart and how well your heart's chambers and valves are working. This procedure takes approximately one hour. There are no restrictions for this procedure.     Your physician has requested that you have coronary CTA. Cardiac computed tomography (CT) is a painless test that uses an x-ray machine to take clear, detailed pictures of your heart. For further information please visit [email or web address] Please follow instruction sheet as given.    Follow-Up: At [facility], you and your health needs are our priority.  As part of our continuing mission to provide you with exceptional heart care, we have created designated Provider Care Teams.  These Care Teams include your primary Cardiologist (physician) and Advanced Practice Providers (APPs -  Physician Assistants and Nurse Practitioners) who all work together to provide you with the care you need, when you need it.     Your next appointment:   3 month(s)  The format for your next appointment:   In Person  Provider:   Bryan Lemma, MD    Other Instructions      Bryan Lemma, M.D., M.S. Interventional Cardiologist   Pager # [phone] Phone # [phone] [address] [place], [place] [address]   Thank you for choosing [facility]!!

## 2021-12-30 ENCOUNTER — Encounter: Payer: Self-pay | Admitting: Cardiology

## 2021-12-30 LAB — BASIC METABOLIC PANEL
BUN/Creatinine Ratio: 18 (ref 9–20)
BUN: 16 mg/dL (ref 6–24)
CO2: 26 mmol/L (ref 20–29)
Calcium: 9.2 mg/dL (ref 8.7–10.2)
Chloride: 97 mmol/L (ref 96–106)
Creatinine, Ser: 0.91 mg/dL (ref 0.76–1.27)
Glucose: 81 mg/dL (ref 70–99)
Potassium: 5.1 mmol/L (ref 3.5–5.2)
Sodium: 138 mmol/L (ref 134–144)
eGFR: 103 mL/min/{1.73_m2} (ref >59)

## 2021-12-30 NOTE — Assessment & Plan Note (Signed)
Jared Robinson is here for preop evaluation.  Unfortunately, he is complaining of both chest pain and exertional dyspnea.  His previous cardiac evaluation did show somewhat reduced EF and equivocal Myoview stress test.  These unresolved issues need to be resolved prior to being able to give good advice as far as preop evaluation.  Cholecystectomy if done laparoscopically is more consistent with an intermediate risk surgery.  He does not have diabetes, history of CKD or CVA.  Provided we can exclude CHF and active CAD with Echocardiogram and Coronary CTA, would recommend proceeding with surgery -> he would not need a return visit prior to surgery if these tests are unremarkable.    However, if there are concerning findings, then these will need to be addressed appropriately prior to reassuring preoperative evaluation.  We will initiate beta-blocker treatment for cardiovascular protection perioperatively.

## 2021-12-30 NOTE — Assessment & Plan Note (Signed)
BP looks pretty good today.  I think he is fine and not being on medication, however with upcoming surgery and prior evidence of reduced EF, who started him on low-dose carvedilol prophylactically for cardiovascular protection perioperative.

## 2021-12-30 NOTE — Assessment & Plan Note (Signed)
He also notes exertional dyspnea which is probably more concerning than his somewhat atypical chest pain symptoms.  In order to better restratify perioperatively, we need to evaluate for both ischemia and persistently reduced EF.  Plan: Check coronary CTA-FFR ct as well as echocardiogram.

## 2021-12-30 NOTE — Assessment & Plan Note (Signed)
He is still having episodes of chest pain which seems somewhat atypical in nature, however is associate with exertion and exertional dyspnea.  Previous evaluation was with a Myoview that was read as nonischemic but there was a fixed defect.  Impella thought to be artifact however is somewhat equivocal.  Plan: Ischemic evaluation with Coronary CTA-FFR ct

## 2021-12-30 NOTE — Assessment & Plan Note (Addendum)
This is a difficult diagnosis to have and not do a preop evaluation on.  Not really sure why his EF was down.  Could be related to his history of substance abuse.  However the stress test showed a fixed defect albeit with normal wall motion.  To be unusual for the echo to also not have a wall motion about if there was indeed a prior infarct.   To clarify, we will repeat assess 2D echo but also assess his recurrent chest pain with a Coronary CTA (Coronary CTA preferable to Myoview because the last one was somewhat equivocal).   Not currently on any medications because he simply stopped taking them.  In preparation for his upcoming surgery, we will Start low-dose beta-blocker => we will start carvedilol 3.25 mg twice daily (on the off chance that he has a relapse of cocaine abuse)

## 2021-12-30 NOTE — Assessment & Plan Note (Signed)
Unfortunately, his last stress test evaluation was somewhat equivocal.  There was a fixed defect that was probably artifact, but his EF was also reduced on both echo and Myoview.  Given the fact that he still having chest pain recommend a more definitive evaluation.  Plan: Check Coronary CTA with possible FFRCT.

## 2022-01-04 ENCOUNTER — Telehealth (HOSPITAL_COMMUNITY): Payer: Self-pay | Admitting: Emergency Medicine

## 2022-01-04 ENCOUNTER — Ambulatory Visit
Admission: RE | Admit: 2022-01-04 | Discharge: 2022-01-04 | Disposition: A | Discharge status: Home / Self Care | Payer: Self-pay | Source: Ambulatory Visit | Attending: Surgery | Admitting: Surgery

## 2022-01-04 DIAGNOSIS — K8020 Calculus of gallbladder without cholecystitis without obstruction: Secondary | ICD-10-CM

## 2022-01-04 NOTE — Telephone Encounter (Signed)
Unable to leave vm Krish Bailly RN Navigator Cardiac Imaging [facility] [phone] Office  [phone] Cell  

## 2022-01-05 ENCOUNTER — Telehealth (HOSPITAL_COMMUNITY): Payer: Self-pay | Admitting: Emergency Medicine

## 2022-01-08 ENCOUNTER — Ambulatory Visit (HOSPITAL_COMMUNITY)
Admission: RE | Admit: 2022-01-08 | Discharge: 2022-01-08 | Disposition: A | Discharge status: Home / Self Care | Payer: Commercial Managed Care - HMO | Source: Ambulatory Visit | Attending: Cardiology | Admitting: Cardiology

## 2022-01-08 DIAGNOSIS — I420 Dilated cardiomyopathy: Secondary | ICD-10-CM | POA: Diagnosis present

## 2022-01-08 DIAGNOSIS — R072 Precordial pain: Secondary | ICD-10-CM | POA: Insufficient documentation

## 2022-01-08 DIAGNOSIS — I10 Essential (primary) hypertension: Secondary | ICD-10-CM | POA: Diagnosis present

## 2022-01-08 DIAGNOSIS — R9439 Abnormal result of other cardiovascular function study: Secondary | ICD-10-CM | POA: Diagnosis present

## 2022-01-08 DIAGNOSIS — R0609 Other forms of dyspnea: Secondary | ICD-10-CM | POA: Diagnosis present

## 2022-01-08 DIAGNOSIS — Z01810 Encounter for preprocedural cardiovascular examination: Secondary | ICD-10-CM | POA: Insufficient documentation

## 2022-01-08 MED ORDER — NITROGLYCERIN 0.4 MG SL SUBL
0.8000 mg | SUBLINGUAL_TABLET | Freq: Once | SUBLINGUAL | Status: AC
  Administered 2022-01-08: 0.8 mg via SUBLINGUAL

## 2022-01-08 MED ORDER — NITROGLYCERIN 0.4 MG SL SUBL
SUBLINGUAL_TABLET | SUBLINGUAL | Status: AC
  Filled 2022-01-08: qty 2

## 2022-01-08 MED ORDER — SODIUM CHLORIDE 0.9 % IV BOLUS
500.0000 mL | Freq: Once | INTRAVENOUS | Status: AC
Start: 2022-01-08 — End: 2022-01-08
  Administered 2022-01-08: 500 mL via INTRAVENOUS

## 2022-01-08 MED ORDER — METOPROLOL TARTRATE 5 MG/5ML IV SOLN
INTRAVENOUS | Status: AC
  Filled 2022-01-08: qty 10

## 2022-01-08 MED ORDER — IOHEXOL 350 MG/ML SOLN
65.0000 mL | Freq: Once | INTRAVENOUS | Status: AC | PRN
  Administered 2022-01-08: 65 mL via INTRAVENOUS

## 2022-01-08 MED ORDER — METOPROLOL TARTRATE 5 MG/5ML IV SOLN
5.0000 mg | INTRAVENOUS | Status: DC | PRN
  Administered 2022-01-08: 5 mg via INTRAVENOUS

## 2022-01-10 ENCOUNTER — Ambulatory Visit (HOSPITAL_COMMUNITY): Payer: Commercial Managed Care - HMO | Attending: Cardiology

## 2022-01-10 DIAGNOSIS — R9439 Abnormal result of other cardiovascular function study: Secondary | ICD-10-CM

## 2022-01-10 DIAGNOSIS — I10 Essential (primary) hypertension: Secondary | ICD-10-CM | POA: Diagnosis present

## 2022-01-10 DIAGNOSIS — R072 Precordial pain: Secondary | ICD-10-CM

## 2022-01-10 DIAGNOSIS — R0609 Other forms of dyspnea: Secondary | ICD-10-CM

## 2022-01-10 DIAGNOSIS — I420 Dilated cardiomyopathy: Secondary | ICD-10-CM | POA: Diagnosis present

## 2022-01-10 DIAGNOSIS — Z01810 Encounter for preprocedural cardiovascular examination: Secondary | ICD-10-CM | POA: Diagnosis present

## 2022-01-10 LAB — ECHOCARDIOGRAM COMPLETE
Area-P 1/2: 3.51 cm2
S' Lateral: 3.7 cm

## 2022-02-28 NOTE — Pre-Procedure Instructions (Signed)
Surgical Instructions    Your procedure is scheduled on Wednesday 03/07/22.   Report to [facility] Main Entrance "A" at 06:30 A.M., then check in with the Admitting office.  Call this number if you have problems the morning of surgery:  [phone]   If you have any questions prior to your surgery date call [phone]: Open Monday-Friday 8am-4pm    Remember:  Do not eat after midnight the night before your surgery  You may drink clear liquids until 05:30 A.M. the morning of your surgery.   Clear liquids allowed are: Water, Non-Citrus Juices (without pulp), Carbonated Beverages, Clear Tea, Black Coffee ONLY (NO MILK, CREAM OR POWDERED CREAMER of any kind), and Gatorade    Take these medicines the morning of surgery with A SIP OF WATER:   carvedilol (COREG)    As of today, STOP taking any Aspirin (unless otherwise instructed by your surgeon) Aleve, Naproxen, Ibuprofen, Motrin, Advil, Goody's, BC's, all herbal medications, fish oil, and all vitamins.           Do not wear jewelry or makeup. Do not wear lotions, powders, perfumes/cologne or deodorant. Do not shave 48 hours prior to surgery.  Men may shave face and neck. Do not bring valuables to the hospital. Do not wear nail polish, gel polish, artificial nails, or any other type of covering on natural nails (fingers and toes) If you have artificial nails or gel coating that need to be removed by a nail salon, please have this removed prior to surgery. Artificial nails or gel coating may interfere with anesthesia's ability to adequately monitor your vital signs.  [facility] is not responsible for any belongings or valuables.    Do NOT Smoke (Tobacco/Vaping)  24 hours prior to your procedure  If you use a CPAP at night, you may bring your mask for your overnight stay.   Contacts, glasses, hearing aids, dentures or partials may not be worn into surgery, please bring cases for these belongings   For patients admitted to the  hospital, discharge time will be determined by your treatment team.   Patients discharged the day of surgery will not be allowed to drive home, and someone needs to stay with them for 24 hours.   SURGICAL WAITING ROOM VISITATION Patients having surgery or a procedure may have no more than 2 support people in the waiting area - these visitors may rotate.   Children under the age of 45 must have an adult with them who is not the patient. If the patient needs to stay at the hospital during part of their recovery, the visitor guidelines for inpatient rooms apply. Pre-op nurse will coordinate an appropriate time for 1 support person to accompany patient in pre-op.  This support person may not rotate.   Please refer to the [facility] website for the visitor guidelines for Inpatients (after your surgery is over and you are in a regular room).    Special instructions:    Oral Hygiene is also important to reduce your risk of infection.  Remember - BRUSH YOUR TEETH THE MORNING OF SURGERY WITH YOUR REGULAR TOOTHPASTE   [place]- Preparing For Surgery  Before surgery, you can play an important role. Because skin is not sterile, your skin needs to be as free of germs as possible. You can reduce the number of germs on your skin by washing with CHG (chlorahexidine gluconate) Soap before surgery.  CHG is an antiseptic cleaner which kills germs and bonds with the skin to  continue killing germs even after washing.     Please do not use if you have an allergy to CHG or antibacterial soaps. If your skin becomes reddened/irritated stop using the CHG.  Do not shave (including legs and underarms) for at least 48 hours prior to first CHG shower. It is OK to shave your face.  Please follow these instructions carefully.     Shower the NIGHT BEFORE SURGERY and the MORNING OF SURGERY with CHG Soap.   If you chose to wash your hair, wash your hair first as usual with your normal shampoo. After you shampoo,  rinse your hair and body thoroughly to remove the shampoo.  Then [employer] and genitals (private parts) with your normal soap and rinse thoroughly to remove soap.  After that Use CHG Soap as you would any other liquid soap. You can apply CHG directly to the skin and wash gently with a scrungie or a clean washcloth.   Apply the CHG Soap to your body ONLY FROM THE NECK DOWN.  Do not use on open wounds or open sores. Avoid contact with your eyes, ears, mouth and genitals (private parts). Wash Face and genitals (private parts)  with your normal soap.   Wash thoroughly, paying special attention to the area where your surgery will be performed.  Thoroughly rinse your body with warm water from the neck down.  DO NOT shower/wash with your normal soap after using and rinsing off the CHG Soap.  Pat yourself dry with a CLEAN TOWEL.  Wear CLEAN PAJAMAS to bed the night before surgery  Place CLEAN SHEETS on your bed the night before your surgery  DO NOT SLEEP WITH PETS.   Day of Surgery:  Take a shower with CHG soap. Wear Clean/Comfortable clothing the morning of surgery Do not apply any deodorants/lotions.   Remember to brush your teeth WITH YOUR REGULAR TOOTHPASTE.    If you received a COVID test during your pre-op visit, it is requested that you wear a mask when out in public, stay away from anyone that may not be feeling well, and notify your surgeon if you develop symptoms. If you have been in contact with anyone that has tested positive in the last 10 days, please notify your surgeon.    Please read over the following fact sheets that you were given.

## 2022-03-01 ENCOUNTER — Encounter (HOSPITAL_COMMUNITY): Payer: Self-pay

## 2022-03-01 ENCOUNTER — Encounter (HOSPITAL_COMMUNITY)
Admission: RE | Admit: 2022-03-01 | Discharge: 2022-03-01 | Disposition: A | Discharge status: Home / Self Care | Payer: Commercial Managed Care - HMO | Source: Ambulatory Visit | Attending: Surgery | Admitting: Surgery

## 2022-03-01 ENCOUNTER — Other Ambulatory Visit: Payer: Self-pay

## 2022-03-01 VITALS — BP 128/82 | HR 98 | Temp 98.5°F | Resp 17 | Ht 70.0 in | Wt 190.2 lb

## 2022-03-01 DIAGNOSIS — Z79899 Other long term (current) drug therapy: Secondary | ICD-10-CM | POA: Diagnosis not present

## 2022-03-01 DIAGNOSIS — I420 Dilated cardiomyopathy: Secondary | ICD-10-CM | POA: Diagnosis not present

## 2022-03-01 DIAGNOSIS — Z01812 Encounter for preprocedural laboratory examination: Secondary | ICD-10-CM | POA: Insufficient documentation

## 2022-03-01 DIAGNOSIS — I10 Essential (primary) hypertension: Secondary | ICD-10-CM | POA: Insufficient documentation

## 2022-03-01 DIAGNOSIS — I358 Other nonrheumatic aortic valve disorders: Secondary | ICD-10-CM | POA: Insufficient documentation

## 2022-03-01 LAB — COMPREHENSIVE METABOLIC PANEL
ALT: 20 U/L (ref 0–44)
AST: 19 U/L (ref 15–41)
Albumin: 4 g/dL (ref 3.5–5.0)
Alkaline Phosphatase: 58 U/L (ref 38–126)
Anion gap: 5 (ref 5–15)
BUN: 9 mg/dL (ref 6–20)
CO2: 26 mmol/L (ref 22–32)
Calcium: 9.3 mg/dL (ref 8.9–10.3)
Chloride: 106 mmol/L (ref 98–111)
Creatinine, Ser: 0.95 mg/dL (ref 0.61–1.24)
GFR, Estimated: >60 mL/min (ref >60)
Glucose, Bld: 98 mg/dL (ref 70–99)
Potassium: 4.3 mmol/L (ref 3.5–5.1)
Sodium: 137 mmol/L (ref 135–145)
Total Bilirubin: 0.6 mg/dL (ref 0.3–1.2)
Total Protein: 6.8 g/dL (ref 6.5–8.1)

## 2022-03-01 LAB — CBC
HCT: 44.1 % (ref 39.0–52.0)
Hemoglobin: 15.4 g/dL (ref 13.0–17.0)
MCH: 29.7 pg (ref 26.0–34.0)
MCHC: 34.9 g/dL (ref 30.0–36.0)
MCV: 85 fL (ref 80.0–100.0)
Platelets: 263 10*3/uL (ref 150–400)
RBC: 5.19 MIL/uL (ref 4.22–5.81)
RDW: 12.3 % (ref 11.5–15.5)
WBC: 10.3 10*3/uL (ref 4.0–10.5)
nRBC: 0 % (ref 0.0–0.2)

## 2022-03-01 NOTE — Progress Notes (Signed)
PCP - Burton Apley Cardiologist - Dr. Herbie Baltimore with [facility] clearance visit in Epic on 12/29/21  PPM/ICD - Denies  Chest x-ray - NI EKG - 12/28/21 Stress Test - 09/13/17 ECHO - 01/10/22 Cardiac Cath - Denies  Sleep Study - Denies  DM - Denies  ERAS Protcol -Yes   COVID TEST- No   Anesthesia review: Yes cardiac clearance was needed testing and notes in EPIC  Patient denies shortness of breath, fever, cough and chest pain at PAT appointment   All instructions explained to the patient, with a verbal understanding of the material. Patient agrees to go over the instructions while at home for a better understanding. The opportunity to ask questions was provided.

## 2022-03-02 NOTE — Progress Notes (Signed)
Anesthesia Chart Review:  History of dilated cardiomyopathy of unclear etiology.  He was seen by Dr. Herbie Baltimore on 12/29/2021 for preop evaluation.  Prior to this, he had been lost to cardiology follow-up for some time.  He was not taking any medications and Dr. Herbie Baltimore restarted him on carvedilol 3.25 mg twice daily.  He also recommended updated echocardiogram and ischemic evaluation with coronary CTA.  Echo showed stable reduced LV function with EF 40 to 45%.  Coronary CT showed calcium score of 0 and no significant coronary disease.  Dr. Herbie Baltimore commented on result stating, "Echocardiogram shows stable mild to moderately reduced pump function with ejection fraction stable at 40 to 45%.  This is the same as it was in 2019. Mild aortic valve calcification but no significant narrowing.  Normal pressures otherwise. In the setting of relatively normal findings on coronary CT angiogram, he should be fine proceeding with surgery."  Preop labs reviewed, WNL.  EKG 12/28/2021: NSR.  Rate 94.  TTE 01/10/2022:  1. Left ventricular ejection fraction, by estimation, is 40 to 45%. The  left ventricle has mildly decreased function. The left ventricle  demonstrates global hypokinesis. Left ventricular diastolic parameters  were normal. The average left ventricular  global longitudinal strain is -14.3 %. The global longitudinal strain is  abnormal.   2. Right ventricular systolic function is normal. The right ventricular  size is normal.   3. The mitral valve is normal in structure. No evidence of mitral valve  regurgitation. No evidence of mitral stenosis.   4. The aortic valve is tricuspid. Aortic valve regurgitation is not  visualized. Aortic valve sclerosis/calcification is present, without any  evidence of aortic stenosis.   5. The inferior vena cava is normal in size with greater than 50%  respiratory variability, suggesting right atrial pressure of 3 mmHg.   Coronary CT 01/08/2022: IMPRESSION: 1.  Coronary artery calcium score 0 Agatston units, suggesting low risk for future cardiac events.   2.  No significant coronary disease noted.    Zannie Cove [facility]/Anesthesiology Phone [phone] 03/02/2022 3:04 PM

## 2022-03-02 NOTE — Anesthesia Preprocedure Evaluation (Addendum)
Anesthesia Evaluation  Patient identified by MRN, date of birth, ID band Patient awake    Reviewed: Allergy & Precautions, NPO status , Patient's Chart, lab work & pertinent test results  Airway Mallampati: II  TM Distance: >3 FB Neck ROM: Full    Dental  (+) Dental Advisory Given   Pulmonary COPD, former smoker,    breath sounds clear to auscultation       Cardiovascular hypertension, Pt. on medications and Pt. on home beta blockers +CHF   Rhythm:Regular Rate:Normal     Neuro/Psych negative neurological ROS     GI/Hepatic negative GI ROS, Neg liver ROS,   Endo/Other  negative endocrine ROS  Renal/GU negative Renal ROS     Musculoskeletal   Abdominal   Peds  Hematology negative hematology ROS (+)   Anesthesia Other Findings   Reproductive/Obstetrics                            Lab Results  Component Value Date   WBC 6.6 05/03/2022   HGB 15.4 05/03/2022   HCT 43.7 05/03/2022   MCV 85.9 05/03/2022   PLT 274 05/03/2022   Lab Results  Component Value Date   CREATININE 0.87 05/03/2022   BUN 12 05/03/2022   NA 135 05/03/2022   K 3.7 05/03/2022   CL 104 05/03/2022   CO2 21 (L) 05/03/2022    Anesthesia Physical Anesthesia Plan  ASA: 3  Anesthesia Plan: General   Post-op Pain Management: Tylenol PO (pre-op)* and Toradol IV (intra-op)*   Induction: Intravenous  PONV Risk Score and Plan: 2 and Dexamethasone and Ondansetron  Airway Management Planned: Oral ETT  Additional Equipment: None  Intra-op Plan:   Post-operative Plan: Extubation in OR  Informed Consent: I have reviewed the patients History and Physical, chart, labs and discussed the procedure including the risks, benefits and alternatives for the proposed anesthesia with the patient or authorized representative who has indicated his/her understanding and acceptance.     Dental advisory given  Plan Discussed  with: CRNA  Anesthesia Plan Comments: (PAT note by Antionette Poles, PA-C: History of dilated cardiomyopathy of unclear etiology.  He was seen by Dr. Herbie Baltimore on 12/29/2021 for preop evaluation.  Prior to this, he had been lost to cardiology follow-up for some time.  He was not taking any medications and Dr. Herbie Baltimore restarted him on carvedilol 3.25 mg twice daily.  He also recommended updated echocardiogram and ischemic evaluation with coronary CTA.  Echo showed stable reduced LV function with EF 40 to 45%.  Coronary CT showed calcium score of 0 and no significant coronary disease.  Dr. Herbie Baltimore commented on result stating, "Echocardiogram shows stable mild to moderately reduced pump function with ejection fraction stable at 40 to 45%. This is the same as it was in 2019. Mild aortic valve calcification but no significant narrowing. Normal pressures otherwise. In the setting of relatively normal findings on coronary CT angiogram, he should be fine proceeding with surgery."  Preop labs reviewed, WNL.  EKG 12/28/2021: NSR.  Rate 94.  TTE 01/10/2022: 1. Left ventricular ejection fraction, by estimation, is 40 to 45%. The  left ventricle has mildly decreased function. The left ventricle  demonstrates global hypokinesis. Left ventricular diastolic parameters  were normal. The average left ventricular  global longitudinal strain is -14.3 %. The global longitudinal strain is  abnormal.  2. Right ventricular systolic function is normal. The right ventricular  size is normal.  3. The  mitral valve is normal in structure. No evidence of mitral valve  regurgitation. No evidence of mitral stenosis.  4. The aortic valve is tricuspid. Aortic valve regurgitation is not  visualized. Aortic valve sclerosis/calcification is present, without any  evidence of aortic stenosis.  5. The inferior vena cava is normal in size with greater than 50%  respiratory variability, suggesting right atrial pressure of 3 mmHg.    Coronary CT 01/08/2022: IMPRESSION: 1. Coronary artery calcium score 0 Agatston units, suggesting low risk for future cardiac events.  2. No significant coronary disease noted.  )       Anesthesia Quick Evaluation

## 2022-03-05 NOTE — Progress Notes (Signed)
Patient called Jared Robinson and was complaining of being congested, not feeling well. Patient was in Jared Robinson on 03/01/22 and he received instructions for the surgery day. Patient verbalized that he doesn't know if he can take any medication for his condition. This Clinical biochemist the patient to call his PCP and ask for advice.

## 2022-03-28 NOTE — Progress Notes (Signed)
Pt called for pre-op time change and instructions. Pt states he did not know that he had surgery scheduled for tomorrow. Pt states that he had received an email stating that he had an appointment with Dr Luisa Hart in October, but had not received anything stating that he had surgery tomorrow. Pt states he had abdominal cramping a couple of days ago which has now turned into back pain. Pt believes that he may have a kidney infection. Pt states he is going to call his PCP today about this. Pt instructed to contact PCP today and get medical care regarding his back pain. Pt instructed to call surgeon's office regarding needing to reschedule. I called [facility] nurse and notified of above information.

## 2022-04-23 ENCOUNTER — Ambulatory Visit: Payer: Commercial Managed Care - HMO | Attending: Cardiology | Admitting: Cardiology

## 2022-05-01 ENCOUNTER — Encounter (HOSPITAL_COMMUNITY): Payer: Self-pay | Admitting: Surgery

## 2022-05-01 ENCOUNTER — Other Ambulatory Visit: Payer: Self-pay

## 2022-05-01 ENCOUNTER — Telehealth: Payer: Self-pay | Admitting: Cardiology

## 2022-05-01 NOTE — Telephone Encounter (Signed)
   Pre-operative Risk Assessment    Patient Name: Jared Robinson  DOB: July 22, 1970 MRN: [id number]      Request for Surgical Clearance    Procedure:   Laproscopic cholecystectomy  Date of Surgery:  Clearance 05/03/22                                 Surgeon:  Dr. Mitzie Na Surgeon's Group or Practice Name:  [facility] Pre-op Phone number:  [phone] Fax number:  [phone]   Type of Clearance Requested:   - Medical    Type of Anesthesia:  Not Indicated   Additional requests/questions:   Caller stated patient will need cardiac clearance.  Signed, Heloise Beecham   05/01/2022, 3:33 PM

## 2022-05-01 NOTE — Progress Notes (Signed)
PCP - Lorene Dy, MD Cardiologist - Glenetta Hew, MD  PPM/ICD - denies  Chest x-ray - n/a EKG - 12/28/21 Stress Test - 09/13/17 ECHO - 01/10/22 Cardiac Cath - denies  CPAP - n/a  Fasting Blood Sugar - n/a  Blood Thinner Instructions: n/a Aspirin Instructions: Patient was instructed: As of today, STOP taking any Aspirin (unless otherwise instructed by your surgeon) Aleve, Naproxen, Ibuprofen, Motrin, Advil, Goody's, BC's, all herbal medications, fish oil, and all vitamins.  ERAS Protcol - yes, until 05:30 o'clock  COVID TEST- n/a  Anesthesia review: yes - cardiac history; patient needs cardiac clearance (Dr. Ellyn Hack office was notified)  Patient verbally denies any shortness of breath, fever, cough and chest pain during phone call   -------------  SDW INSTRUCTIONS given:  Your procedure is scheduled on Thursday, October 19th, 2023.  Report to [facility] Main Entrance "A" at 05:30 A.M., and check in at the Admitting office.  Call this number if you have problems the morning of surgery:  [phone]   Remember:  Do not eat after midnight the night before your surgery  You may drink clear liquids until 05:30 the morning of your surgery.   Clear liquids allowed are: Water, Non-Citrus Juices (without pulp), Carbonated Beverages, Clear Tea, Black Coffee Only, and Gatorade    Take these medicines the morning of surgery with A SIP OF WATER Coreg   The day of surgery:                    Do not wear jewelry,             Do not wear lotions, powders, colognes, or deodorant.            Men may shave face and neck.            Do not bring valuables to the hospital.            [facility] is not responsible for any belongings or valuables.  Do NOT Smoke (Tobacco/Vaping) 24 hours prior to your procedure If you use a CPAP at night, you may bring all equipment for your overnight stay.   Contacts, glasses, dentures or bridgework may not be worn into surgery.      For  patients admitted to the hospital, discharge time will be determined by your treatment team.   Patients discharged the day of surgery will not be allowed to drive home, and someone needs to stay with them for 24 hours.    Special instructions:   [place]- Preparing For Surgery  Before surgery, you can play an important role. Because skin is not sterile, your skin needs to be as free of germs as possible. You can reduce the number of germs on your skin by washing with CHG (chlorahexidine gluconate) Soap before surgery.  CHG is an antiseptic cleaner which kills germs and bonds with the skin to continue killing germs even after washing.    Oral Hygiene is also important to reduce your risk of infection.  Remember - BRUSH YOUR TEETH THE MORNING OF SURGERY WITH YOUR REGULAR TOOTHPASTE  Please do not use if you have an allergy to CHG or antibacterial soaps. If your skin becomes reddened/irritated stop using the CHG.  Do not shave (including legs and underarms) for at least 48 hours prior to first CHG shower. It is OK to shave your face.  Please follow these instructions carefully.   Shower the NIGHT BEFORE SURGERY and the MORNING OF SURGERY  with DIAL Soap.   Pat yourself dry with a CLEAN TOWEL.  Wear CLEAN PAJAMAS to bed the night before surgery  Place CLEAN SHEETS on your bed the night of your first shower and DO NOT SLEEP WITH PETS.   Day of Surgery: Please shower morning of surgery  Wear Clean/Comfortable clothing the morning of surgery Do not apply any deodorants/lotions.   Remember to brush your teeth WITH YOUR REGULAR TOOTHPASTE.   Questions were answered. Patient verbalized understanding of instructions.

## 2022-05-01 NOTE — Telephone Encounter (Signed)
   Name: Jared Robinson  DOB: March 19, 1971  MRN: [id number]   Primary Cardiologist: Glenetta Hew, MD  Chart reviewed as part of pre-operative protocol coverage. Patient was contacted 05/01/2022 in reference to pre-operative risk assessment for pending surgery as outlined below.  Jared Robinson was last seen on 01/08/2022 by Dr. Ellyn Hack.  Since that day, Jared Robinson has done well from a cardiac standpoint.  His coronary CT angiogram and echocardiogram were unremarkable.  He was previously cleared for this procedure by Dr. Ellyn Hack in June 2023.  He denies any new symptoms or concerns.  He is able to complete greater than 4 METS without difficulty.  Therefore, based on ACC/AHA guidelines, the patient would be at acceptable risk for the planned procedure without further cardiovascular testing.   The patient was advised that if he develops new symptoms prior to surgery to contact our office to arrange for a follow-up visit, and he verbalized understanding.  I will route this recommendation to the requesting party via Epic fax function and remove from pre-op pool. Please call with questions.  Lenna Sciara, NP 05/01/2022, 3:50 PM

## 2022-05-02 ENCOUNTER — Ambulatory Visit: Payer: Self-pay | Admitting: Surgery

## 2022-05-02 DIAGNOSIS — K8020 Calculus of gallbladder without cholecystitis without obstruction: Secondary | ICD-10-CM

## 2022-05-03 ENCOUNTER — Ambulatory Visit (HOSPITAL_COMMUNITY)
Admission: RE | Admit: 2022-05-03 | Discharge: 2022-05-03 | Disposition: A | Discharge status: Home / Self Care | Payer: Commercial Managed Care - HMO | Attending: Surgery | Admitting: Surgery

## 2022-05-03 ENCOUNTER — Ambulatory Visit (HOSPITAL_COMMUNITY): Payer: Commercial Managed Care - HMO

## 2022-05-03 ENCOUNTER — Encounter (HOSPITAL_COMMUNITY): Payer: Self-pay | Admitting: Surgery

## 2022-05-03 ENCOUNTER — Other Ambulatory Visit: Payer: Self-pay

## 2022-05-03 ENCOUNTER — Ambulatory Visit (HOSPITAL_BASED_OUTPATIENT_CLINIC_OR_DEPARTMENT_OTHER): Payer: Commercial Managed Care - HMO | Admitting: Vascular Surgery

## 2022-05-03 ENCOUNTER — Ambulatory Visit (HOSPITAL_COMMUNITY): Payer: Commercial Managed Care - HMO | Admitting: Physician Assistant

## 2022-05-03 ENCOUNTER — Encounter (HOSPITAL_COMMUNITY)
Admission: RE | Disposition: A | Discharge status: Home / Self Care | Payer: Self-pay | Source: Home / Self Care | Attending: Surgery

## 2022-05-03 DIAGNOSIS — Z87891 Personal history of nicotine dependence: Secondary | ICD-10-CM | POA: Diagnosis not present

## 2022-05-03 DIAGNOSIS — I110 Hypertensive heart disease with heart failure: Secondary | ICD-10-CM | POA: Diagnosis not present

## 2022-05-03 DIAGNOSIS — I509 Heart failure, unspecified: Secondary | ICD-10-CM

## 2022-05-03 DIAGNOSIS — Z8249 Family history of ischemic heart disease and other diseases of the circulatory system: Secondary | ICD-10-CM | POA: Diagnosis not present

## 2022-05-03 DIAGNOSIS — K8010 Calculus of gallbladder with chronic cholecystitis without obstruction: Secondary | ICD-10-CM | POA: Diagnosis present

## 2022-05-03 DIAGNOSIS — J449 Chronic obstructive pulmonary disease, unspecified: Secondary | ICD-10-CM | POA: Diagnosis not present

## 2022-05-03 DIAGNOSIS — Z79899 Other long term (current) drug therapy: Secondary | ICD-10-CM | POA: Insufficient documentation

## 2022-05-03 DIAGNOSIS — K8020 Calculus of gallbladder without cholecystitis without obstruction: Secondary | ICD-10-CM

## 2022-05-03 HISTORY — PX: CHOLECYSTECTOMY: SHX55

## 2022-05-03 HISTORY — DX: Essential (primary) hypertension: I10

## 2022-05-03 LAB — COMPREHENSIVE METABOLIC PANEL
ALT: 21 U/L (ref 0–44)
AST: 18 U/L (ref 15–41)
Albumin: 4 g/dL (ref 3.5–5.0)
Alkaline Phosphatase: 54 U/L (ref 38–126)
Anion gap: 10 (ref 5–15)
BUN: 12 mg/dL (ref 6–20)
CO2: 21 mmol/L — ABNORMAL LOW (ref 22–32)
Calcium: 9 mg/dL (ref 8.9–10.3)
Chloride: 104 mmol/L (ref 98–111)
Creatinine, Ser: 0.87 mg/dL (ref 0.61–1.24)
GFR, Estimated: >60 mL/min (ref >60)
Glucose, Bld: 98 mg/dL (ref 70–99)
Potassium: 3.7 mmol/L (ref 3.5–5.1)
Sodium: 135 mmol/L (ref 135–145)
Total Bilirubin: 0.6 mg/dL (ref 0.3–1.2)
Total Protein: 6.8 g/dL (ref 6.5–8.1)

## 2022-05-03 LAB — CBC
HCT: 43.7 % (ref 39.0–52.0)
Hemoglobin: 15.4 g/dL (ref 13.0–17.0)
MCH: 30.3 pg (ref 26.0–34.0)
MCHC: 35.2 g/dL (ref 30.0–36.0)
MCV: 85.9 fL (ref 80.0–100.0)
Platelets: 274 10*3/uL (ref 150–400)
RBC: 5.09 MIL/uL (ref 4.22–5.81)
RDW: 12.6 % (ref 11.5–15.5)
WBC: 6.6 10*3/uL (ref 4.0–10.5)
nRBC: 0 % (ref 0.0–0.2)

## 2022-05-03 SURGERY — LAPAROSCOPIC CHOLECYSTECTOMY WITH INTRAOPERATIVE CHOLANGIOGRAM
Anesthesia: General | Site: Abdomen

## 2022-05-03 MED ORDER — BUPIVACAINE-EPINEPHRINE 0.25% -1:200000 IJ SOLN
INTRAMUSCULAR | Status: DC | PRN
  Administered 2022-05-03: 12 mL

## 2022-05-03 MED ORDER — PHENYLEPHRINE HCL (PRESSORS) 10 MG/ML IV SOLN
INTRAVENOUS | Status: DC | PRN
  Administered 2022-05-03 (×3): 80 ug via INTRAVENOUS

## 2022-05-03 MED ORDER — EPHEDRINE SULFATE (PRESSORS) 50 MG/ML IJ SOLN
INTRAMUSCULAR | Status: DC | PRN
  Administered 2022-05-03: 5 mg via INTRAVENOUS

## 2022-05-03 MED ORDER — EPHEDRINE 5 MG/ML INJ
INTRAVENOUS | Status: AC
  Filled 2022-05-03: qty 5

## 2022-05-03 MED ORDER — INDOCYANINE GREEN 25 MG IV SOLR: 7.5000 mg | Freq: Once | INTRAVENOUS | Status: DC

## 2022-05-03 MED ORDER — CHLORHEXIDINE GLUCONATE CLOTH 2 % EX PADS: 6.0000 | MEDICATED_PAD | Freq: Once | CUTANEOUS | Status: DC

## 2022-05-03 MED ORDER — FENTANYL CITRATE (PF) 100 MCG/2ML IJ SOLN
INTRAMUSCULAR | Status: DC | PRN
  Administered 2022-05-03 (×5): 50 ug via INTRAVENOUS

## 2022-05-03 MED ORDER — ORAL CARE MOUTH RINSE: 15.0000 mL | Freq: Once | OROMUCOSAL | Status: AC

## 2022-05-03 MED ORDER — SUGAMMADEX SODIUM 200 MG/2ML IV SOLN
INTRAVENOUS | Status: DC | PRN
  Administered 2022-05-03: 200 mg via INTRAVENOUS

## 2022-05-03 MED ORDER — ACETAMINOPHEN 500 MG PO TABS
ORAL_TABLET | ORAL | Status: AC
  Administered 2022-05-03: 1000 mg via ORAL
  Filled 2022-05-03: qty 2

## 2022-05-03 MED ORDER — CHLORHEXIDINE GLUCONATE 0.12 % MT SOLN
15.0000 mL | Freq: Once | OROMUCOSAL | Status: AC
  Administered 2022-05-03: 15 mL via OROMUCOSAL
  Filled 2022-05-03: qty 15

## 2022-05-03 MED ORDER — LIDOCAINE 2% (20 MG/ML) 5 ML SYRINGE
INTRAMUSCULAR | Status: DC | PRN
  Administered 2022-05-03: 60 mg via INTRAVENOUS

## 2022-05-03 MED ORDER — PROPOFOL 10 MG/ML IV BOLUS
INTRAVENOUS | Status: AC
  Filled 2022-05-03: qty 20

## 2022-05-03 MED ORDER — ACETAMINOPHEN 500 MG PO TABS: 1000.0000 mg | ORAL_TABLET | Freq: Once | ORAL | Status: AC

## 2022-05-03 MED ORDER — LACTATED RINGERS IV SOLN: INTRAVENOUS | Status: DC

## 2022-05-03 MED ORDER — DEXAMETHASONE SODIUM PHOSPHATE 10 MG/ML IJ SOLN
INTRAMUSCULAR | Status: DC | PRN
  Administered 2022-05-03: 5 mg via INTRAVENOUS

## 2022-05-03 MED ORDER — FENTANYL CITRATE (PF) 250 MCG/5ML IJ SOLN
INTRAMUSCULAR | Status: AC
  Filled 2022-05-03: qty 5

## 2022-05-03 MED ORDER — FENTANYL CITRATE (PF) 100 MCG/2ML IJ SOLN
INTRAMUSCULAR | Status: AC
  Filled 2022-05-03: qty 2

## 2022-05-03 MED ORDER — BUPIVACAINE-EPINEPHRINE (PF) 0.25% -1:200000 IJ SOLN
INTRAMUSCULAR | Status: AC
  Filled 2022-05-03: qty 30

## 2022-05-03 MED ORDER — PROPOFOL 10 MG/ML IV BOLUS
INTRAVENOUS | Status: DC | PRN
  Administered 2022-05-03: 150 mg via INTRAVENOUS

## 2022-05-03 MED ORDER — MIDAZOLAM HCL 2 MG/2ML IJ SOLN
INTRAMUSCULAR | Status: AC
  Filled 2022-05-03: qty 2

## 2022-05-03 MED ORDER — ROCURONIUM BROMIDE 100 MG/10ML IV SOLN
INTRAVENOUS | Status: DC | PRN
  Administered 2022-05-03: 40 mg via INTRAVENOUS
  Administered 2022-05-03 (×2): 10 mg via INTRAVENOUS

## 2022-05-03 MED ORDER — FENTANYL CITRATE (PF) 100 MCG/2ML IJ SOLN
25.0000 ug | INTRAMUSCULAR | Status: DC | PRN
  Administered 2022-05-03 (×2): 50 ug via INTRAVENOUS

## 2022-05-03 MED ORDER — 0.9 % SODIUM CHLORIDE (POUR BTL) OPTIME
TOPICAL | Status: DC | PRN
  Administered 2022-05-03: 1000 mL

## 2022-05-03 MED ORDER — OXYCODONE HCL 5 MG PO TABS: 5.0000 mg | ORAL_TABLET | Freq: Four times a day (QID) | ORAL | 0 refills | Status: DC | PRN

## 2022-05-03 MED ORDER — MIDAZOLAM HCL 5 MG/5ML IJ SOLN
INTRAMUSCULAR | Status: DC | PRN
  Administered 2022-05-03: 2 mg via INTRAVENOUS

## 2022-05-03 MED ORDER — ONDANSETRON HCL 4 MG/2ML IJ SOLN
INTRAMUSCULAR | Status: DC | PRN
  Administered 2022-05-03: 4 mg via INTRAVENOUS

## 2022-05-03 MED ORDER — SODIUM CHLORIDE 0.9 % IR SOLN
Status: DC | PRN
  Administered 2022-05-03: 1

## 2022-05-03 MED ORDER — CEFAZOLIN SODIUM-DEXTROSE 2-4 GM/100ML-% IV SOLN
2.0000 g | INTRAVENOUS | Status: AC
  Administered 2022-05-03: 2 g via INTRAVENOUS
  Filled 2022-05-03: qty 100

## 2022-05-03 MED ORDER — AMISULPRIDE (ANTIEMETIC) 5 MG/2ML IV SOLN: 10.0000 mg | Freq: Once | INTRAVENOUS | Status: DC | PRN

## 2022-05-03 SURGICAL SUPPLY — 41 items
APPLIER CLIP ROT 10 11.4 M/L (STAPLE) ×1
BLADE CLIPPER SURG (BLADE) IMPLANT
CANISTER SUCT 3000ML PPV (MISCELLANEOUS) ×1 IMPLANT
CHLORAPREP W/TINT 26 (MISCELLANEOUS) ×1 IMPLANT
CLIP APPLIE ROT 10 11.4 M/L (STAPLE) ×1 IMPLANT
COVER MAYO STAND STRL (DRAPES) ×1 IMPLANT
COVER SURGICAL LIGHT HANDLE (MISCELLANEOUS) ×1 IMPLANT
DERMABOND ADVANCED .7 DNX12 (GAUZE/BANDAGES/DRESSINGS) ×1 IMPLANT
ELECT REM PT RETURN 9FT ADLT (ELECTROSURGICAL) ×1
ELECTRODE REM PT RTRN 9FT ADLT (ELECTROSURGICAL) ×1 IMPLANT
GLOVE BIO SURGEON STRL SZ8 (GLOVE) ×1 IMPLANT
GLOVE BIOGEL PI IND STRL 8 (GLOVE) ×1 IMPLANT
GOWN STRL REUS W/ TWL LRG LVL3 (GOWN DISPOSABLE) ×2 IMPLANT
GOWN STRL REUS W/ TWL XL LVL3 (GOWN DISPOSABLE) ×1 IMPLANT
GOWN STRL REUS W/TWL LRG LVL3 (GOWN DISPOSABLE) ×2
GOWN STRL REUS W/TWL XL LVL3 (GOWN DISPOSABLE) ×1
KIT BASIN OR (CUSTOM PROCEDURE TRAY) ×1 IMPLANT
KIT TURNOVER KIT B (KITS) ×1 IMPLANT
NDL INSUFFLATION 14GA 120MM (NEEDLE) IMPLANT
NEEDLE INSUFFLATION 14GA 120MM (NEEDLE) ×1 IMPLANT
NS IRRIG 1000ML POUR BTL (IV SOLUTION) ×1 IMPLANT
PAD ARMBOARD 7.5X6 YLW CONV (MISCELLANEOUS) ×1 IMPLANT
POUCH RETRIEVAL ECOSAC 10 (ENDOMECHANICALS) ×1 IMPLANT
POUCH RETRIEVAL ECOSAC 10MM (ENDOMECHANICALS) ×1
SCISSORS LAP 5X35 DISP (ENDOMECHANICALS) ×1 IMPLANT
SET IRRIG TUBING LAPAROSCOPIC (IRRIGATION / IRRIGATOR) ×1 IMPLANT
SET TUBE SMOKE EVAC HIGH FLOW (TUBING) ×1 IMPLANT
SLEEVE ENDOPATH XCEL 5M (ENDOMECHANICALS) ×1 IMPLANT
SPECIMEN JAR SMALL (MISCELLANEOUS) ×1 IMPLANT
SUT MNCRL AB 4-0 PS2 18 (SUTURE) ×1 IMPLANT
SUT VICRYL 0 UR6 27IN ABS (SUTURE) IMPLANT
TOWEL GREEN STERILE (TOWEL DISPOSABLE) ×1 IMPLANT
TOWEL GREEN STERILE FF (TOWEL DISPOSABLE) ×1 IMPLANT
TRAY LAPAROSCOPIC MC (CUSTOM PROCEDURE TRAY) ×1 IMPLANT
TROCAR XCEL BLADELESS 5X75MML (TROCAR) IMPLANT
TROCAR XCEL BLUNT TIP 100MML (ENDOMECHANICALS) ×1 IMPLANT
TROCAR XCEL NON-BLD 11X100MML (ENDOMECHANICALS) ×1 IMPLANT
TROCAR Z THREAD OPTICAL 12X100 (TROCAR) IMPLANT
TROCAR Z-THREAD OPTICAL 5X100M (TROCAR) ×1 IMPLANT
WARMER LAPAROSCOPE (MISCELLANEOUS) ×1 IMPLANT
WATER STERILE IRR 1000ML POUR (IV SOLUTION) ×1 IMPLANT

## 2022-05-03 NOTE — Op Note (Signed)
Laparoscopic Cholecystectomy  Procedure Note  Indications: This patient presents with symptomatic gallbladder disease and will undergo laparoscopic cholecystectomy. The procedure has been discussed with the patient. Operative and non operative treatments have been discussed. Risks of surgery include bleeding, infection,  Common bile duct injury,  Injury to the stomach,liver, colon,small intestine, abdominal wall,  Diaphragm,  Major blood vessels,  And the need for an open procedure.  Other risks include worsening of medical problems, death,  DVT and pulmonary embolism, and cardiovascular events.   Medical options have also been discussed. The patient has been informed of long term expectations of surgery and non surgical options,  The patient agrees to proceed.    Pre-operative Diagnosis: Calculus of gallbladder with other cholecystitis, without mention of obstruction  Post-operative Diagnosis: Same  Surgeon: Dortha Schwalbe  MD   Assistants: Sharene Skeans RNFA   Anesthesia: General endotracheal anesthesia and Local anesthesia 0.25.% bupivacaine  ASA Class: 2  Procedure Details  The patient was seen again in the Holding Room. The risks, benefits, complications, treatment options, and expected outcomes were discussed with the patient. The possibilities of reaction to medication, pulmonary aspiration, perforation of viscus, bleeding, recurrent infection, finding a normal gallbladder, the need for additional procedures, failure to diagnose a condition, the possible need to convert to an open procedure, and creating a complication requiring transfusion or operation were discussed with the patient. The patient and/or family concurred with the proposed plan, giving informed consent. The site of surgery properly noted/marked. The patient was taken to Operating Room, identified as Jared Robinson and the procedure verified as Laparoscopic Cholecystectomy with Intraoperative Cholangiograms. A Time Out was held  and the above information confirmed.  Prior to the induction of general anesthesia, antibiotic prophylaxis was administered. General endotracheal anesthesia was then administered and tolerated well. After the induction, the abdomen was prepped in the usual sterile fashion. The patient was positioned in the supine position with the left arm comfortably tucked, along with some reverse Trendelenburg.  Patient had a previous laparotomy scar.  In the left upper quadrant a 5 mm Optiview port was placed.  A small incision was made in the port was advanced with the help of the laparoscope through all abdominal walls intra-abdominal cavity safely.  Pneumoperitoneum was created to 15 mmHg of CO2 pressure.  There was some midline adhesions noted.  I then placed a right lower quadrant 5 mm port under direct vision.  And from this port I could place the 11 mm subxiphoid port under direct vision.  We then made a small incision just below the umbilicus.  Dissection was carried down a pursestring suture was placed in the fascia.  We opened the fascia and placed a Hassan port there and secured it.  An additional 5 mm port was placed in the right lower quadrant.  No evidence of visceral injury with insertion of ports.  The gallbladder was identified, the fundus grasped and retracted cephalad. Adhesions were lysed bluntly and with the electrocautery where indicated, taking care not to injure any adjacent organs or viscus. The infundibulum was grasped and retracted laterally, exposing the peritoneum overlying the triangle of Calot. This was then divided and exposed in a blunt fashion. The cystic duct was clearly identified and bluntly dissected circumferentially. The junctions of the gallbladder, cystic duct and common bile duct were clearly identified prior to the division of any linear structure.   The critical view was obtained.  The cystic duct was quite small therefore cholangiogram was not performed  due to small cystic  duct.  The cystic duct was then clipped and divided.  The cystic artery was divided in a similar fashion.  .  The gallbladder was dissected from the liver bed in retrograde fashion with the electrocautery. The gallbladder was removed. The liver bed was irrigated and inspected. Hemostasis was achieved with the electrocautery. Copious irrigation was utilized and was repeatedly aspirated until clear all particulate matter. Hemostasis was achieved with no signs  Of bleeding or bile leakage.  The gallbladder was extracted the umbilical port and passed in the field.  0 Vicryl was used to close the umbilical port site.  Upon final inspection there is no evidence of bleeding or bile leakage.  All ports were removed.  Pneumoperitoneum was completely reduced after viewing removal of the trocars under direct vision. The wound was thoroughly irrigated and the fascia was then closed with a figure of eight suture; the skin was then closed with 4 0 MONOCRYL  and a sterile dressing was applied.  Instrument, sponge, and needle counts were correct at closure and at the conclusion of the case.   Findings: Cholecystitis with Cholelithiasis  Estimated Blood Loss: less than 50 mL         Drains: NONE          Total IV Fluids: PER RECORD          Specimens: Gallbladder           Complications: None; patient tolerated the procedure well.         Disposition: PACU - hemodynamically stable.         Condition: stable

## 2022-05-03 NOTE — Interval H&P Note (Signed)
History and Physical Interval Note:  05/03/2022 7:27 AM  [name]  has presented today for surgery, with the diagnosis of gallstones.  The various methods of treatment have been discussed with the patient and family. After consideration of risks, benefits and other options for treatment, the patient has consented to  Procedure(s): LAPAROSCOPIC CHOLECYSTECTOMY WITH POSSIBLE INTRAOPERATIVE CHOLANGIOGRAM (N/A) as a surgical intervention.  The patient's history has been reviewed, patient examined, no change in status, stable for surgery.  I have reviewed the patient's chart and labs.  Questions were answered to the patient's satisfaction.   The procedure has been discussed with the patient. Operative and non operative treatments have been discussed. Risks of surgery include bleeding, infection,  Common bile duct injury,  Injury to the stomach,liver, colon,small intestine, abdominal wall,  Diaphragm,  Major blood vessels,  And the need for an open procedure.  Other risks include worsening of medical problems, death,  DVT and pulmonary embolism, and cardiovascular events.   Medical options have also been discussed. The patient has been informed of long term expectations of surgery and non surgical options,  The patient agrees to proceed.     [place]

## 2022-05-03 NOTE — Anesthesia Postprocedure Evaluation (Signed)
Anesthesia Post Note  Patient: Jared Robinson  Procedure(s) Performed: LAPAROSCOPIC CHOLECYSTECTOMY WITH POSSIBLE INTRAOPERATIVE CHOLANGIOGRAM (Abdomen)     Patient location during evaluation: PACU Anesthesia Type: General Level of consciousness: awake and alert Pain management: pain level controlled Vital Signs Assessment: post-procedure vital signs reviewed and stable Respiratory status: spontaneous breathing, nonlabored ventilation, respiratory function stable and patient connected to nasal cannula oxygen Cardiovascular status: blood pressure returned to baseline and stable Postop Assessment: no apparent nausea or vomiting Anesthetic complications: no   No notable events documented.  Last Vitals:  Vitals:   05/03/22 0942 05/03/22 0957  BP: (!) 127/98 122/86  Pulse: 86 88  Resp: 15 11  Temp:  37.1 C  SpO2: 97% 98%    Last Pain:  Vitals:   05/03/22 0957  TempSrc:   PainSc: 3                  Tiajuana Amass

## 2022-05-03 NOTE — Transfer of Care (Signed)
Immediate Anesthesia Transfer of Care Note  Patient: Jared Robinson  Procedure(s) Performed: LAPAROSCOPIC CHOLECYSTECTOMY WITH POSSIBLE INTRAOPERATIVE CHOLANGIOGRAM (Abdomen)  Patient Location: PACU  Anesthesia Type:General  Level of Consciousness: awake, alert , and oriented  Airway & Oxygen Therapy: Patient Spontanous Breathing and Patient connected to face mask oxygen  Post-op Assessment: Report given to RN and Post -op Vital signs reviewed and stable  Post vital signs: Reviewed and stable  Last Vitals:  Vitals Value Taken Time  BP 137/92 05/03/22 0927  Temp    Pulse 91 05/03/22 0928  Resp 15 05/03/22 0928  SpO2 100 % 05/03/22 0928  Vitals shown include unvalidated device data.  Last Pain:  Vitals:   05/03/22 0652  TempSrc:   PainSc: 0-No pain         Complications: No notable events documented.

## 2022-05-03 NOTE — Discharge Instructions (Signed)
[facility] ______[facility] LAPAROSCOPIC SURGERY: POST OP INSTRUCTIONS Always review your discharge instruction sheet given to you by the facility where your surgery was performed. IF YOU HAVE DISABILITY OR FAMILY LEAVE FORMS, YOU MUST BRING THEM TO THE OFFICE FOR PROCESSING.   DO NOT GIVE THEM TO YOUR DOCTOR.  A prescription for pain medication may be given to you upon discharge.  Take your pain medication as prescribed, if needed.  If narcotic pain medicine is not needed, then you may take acetaminophen (Tylenol) or ibuprofen (Advil) as needed. Take your usually prescribed medications unless otherwise directed. If you need a refill on your pain medication, please contact your pharmacy.  They will contact our office to request authorization. Prescriptions will not be filled after 5pm or on week-ends. You should follow a light diet the first few days after arrival home, such as soup and crackers, etc.  Be sure to include lots of fluids daily. Most patients will experience some swelling and bruising in the area of the incisions.  Ice packs will help.  Swelling and bruising can take several days to resolve.  It is common to experience some constipation if taking pain medication after surgery.  Increasing fluid intake and taking a stool softener (such as Colace) will usually help or prevent this problem from occurring.  A mild laxative (Milk of Magnesia or Miralax) should be taken according to package instructions if there are no bowel movements after 48 hours. Unless discharge instructions indicate otherwise, you may remove your bandages 24-48 hours after surgery, and you may shower at that time.  You may have steri-strips (small skin tapes) in place directly over the incision.  These strips should be left on the skin for 7-10 days.  If your surgeon used skin glue on the incision, you may shower in 24 hours.  The glue will flake off over the next 2-3 weeks.  Any sutures or staples will be  removed at the office during your follow-up visit. ACTIVITIES:  You may resume regular (light) daily activities beginning the next day--such as daily self-care, walking, climbing stairs--gradually increasing activities as tolerated.  You may have sexual intercourse when it is comfortable.  Refrain from any heavy lifting or straining until approved by your doctor. You may drive when you are no longer taking prescription pain medication, you can comfortably wear a seatbelt, and you can safely maneuver your car and apply brakes. RETURN TO WORK:  __________________________________________________________ You should see your doctor in the office for a follow-up appointment approximately 2-3 weeks after your surgery.  Make sure that you call for this appointment within a day or two after you arrive home to insure a convenient appointment time. OTHER INSTRUCTIONS: __________________________________________________________________________________________________________________________ __________________________________________________________________________________________________________________________ WHEN TO CALL YOUR DOCTOR: Fever over 101.0 Inability to urinate Continued bleeding from incision. Increased pain, redness, or drainage from the incision. Increasing abdominal pain  The clinic staff is available to answer your questions during regular business hours.  Please don't hesitate to call and ask to speak to one of the nurses for clinical concerns.  If you have a medical emergency, go to the nearest emergency room or call 911.  A surgeon from [facility] is always on call at the hospital. [address], [address], [place], [place]  [address] ? [address] [address], [place], [place]   [address] [phone] ? [phone] ? FAX [phone] Web site: [email or web address]  

## 2022-05-03 NOTE — H&P (Signed)
Patient presents for evaluation of right upper quad abdominal pain. This was first noted back in 2019. Apparently was seen by a surgeon but does remember who that was. He had a work-up for potential surgery but that fell through. History of a cardiomyopathy as well as cocaine use in the past. He has had intermittent upper quad abdominal pain especially after fatty meals. This pain is gotten worse. He does have a document history of gallstones and signs and symptoms consistent with biliary colic. He had an attack this morning that went away on its own. He ate a pot roast with butter in it last night which he thinks is what caused it. His symptoms get better with nausea and vomiting. Sometimes it may take a couple hours for to resolve. He was last seen in the emergency room back in 2021. No further ED visits since then. No history of jaundice, itching or yellowing of the eyes.  Review of Systems: A complete review of systems was obtained from the patient. I have reviewed this information and discussed as appropriate with the patient. See HPI as well for other ROS.    Medical History: Past Medical History: Diagnosis Date  Anxiety  CHF (congestive heart failure) (CMS-HCC)  COPD (chronic obstructive pulmonary disease) (CMS-HCC)  There is no problem list on file for this patient.  Past Surgical History: Procedure Laterality Date  HERNIA REPAIR   Allergies Allergen Reactions  Aspirin Rash  No current outpatient medications on file prior to visit.  No current facility-administered medications on file prior to visit.  Family History Problem Relation Age of Onset  Skin cancer Father  High blood pressure (Hypertension) Father   Social History  Tobacco Use Smoking Status Former  Types: Cigarettes Smokeless Tobacco Never   Social History  Socioeconomic History  Marital status: Single Tobacco Use  Smoking status: Former Types: Cigarettes  Smokeless tobacco: Never Substance and  Sexual Activity  Alcohol use: Never  Drug use: Yes Types: Marijuana  Objective:  Vitals: 12/12/21 1445 BP: 126/82 Pulse: 109 Temp: 36.8 C (98.3 F) SpO2: 98% Weight: 84.6 kg (186 lb 6.4 oz) Height: 179.1 cm (5' 10.5")  Body mass index is 26.37 kg/m.  Physical Exam HENT: Head: Normocephalic. Eyes: General: No scleral icterus. Cardiovascular: Rate and Rhythm: Normal rate. Abdominal: General: Abdomen is flat. Palpations: Abdomen is soft. Tenderness: There is no abdominal tenderness.  Comments: Previous midline incision noted. This was over 20 years ago. He thinks it was a hernia repair. Musculoskeletal: General: Normal range of motion. Cervical back: Normal range of motion. Skin: General: Skin is warm and dry. Neurological: General: No focal deficit present. Mental Status: He is alert and oriented to person, place, and time.    Labs, Imaging and Diagnostic Testing: CLINICAL DATA: Right upper quadrant pain after eating fatty foods for 2 years.   EXAM: ABDOMEN ULTRASOUND COMPLETE   COMPARISON: None.   FINDINGS: Gallbladder: Gallstones of up to 19 mm. No wall thickening or pericholecystic fluid. Sonographic Murphy's sign was not elicited.   Common bile duct: Diameter: Normal, 5 mm.   Liver: No focal lesion identified. Within normal limits in parenchymal echogenicity. Portal vein is patent on color Doppler imaging with normal direction of blood flow towards the liver.   IVC: No abnormality visualized.   Pancreas: Visualized portion unremarkable.   Spleen: Size and appearance within normal limits.   Right Kidney: Length: 10.1 cm. Echogenicity within normal limits. No mass or hydronephrosis visualized.   Left Kidney: Length: 10.9 cm. Echogenicity  within normal limits. No mass or hydronephrosis visualized.   Abdominal aorta: No aneurysm visualized.   Other findings: No ascites.   IMPRESSION: Cholelithiasis without acute cholecystitis or biliary  duct dilatation.     Electronically Signed By: Abigail Miyamoto M.D. On: 05/25/2020 16:32  Assessment and Plan:  Diagnoses and all orders for this visit:  Gallstones - US gallbladder; Future   Discussed laparoscopic possible open cholecystectomy due to previous abdominal surgery. Discussed risks and benefits of surgery but since his symptoms are worsening, I do not feel continued observation or medical management appropriate for him. Recommend laparoscopic cholecystectomy with possible cholangiogram possible open procedure depending on intra-abdominal adhesions. Risk of surgery include but not exclusive of bleeding, infection, common bile duct injury, open surgery, organ injury, cardiovascular complication from previous existing disease, the need for other treatments and procedures, bile leak, wound infection, and rarely death.

## 2022-05-03 NOTE — Anesthesia Procedure Notes (Signed)
Procedure Name: Intubation Date/Time: 05/03/2022 7:48 AM  Performed by: Lavonia Dana, CRNAPre-anesthesia Checklist: Patient identified, Emergency Drugs available, Suction available and Patient being monitored Patient Re-evaluated:Patient Re-evaluated prior to induction Oxygen Delivery Method: Circle system utilized Preoxygenation: Pre-oxygenation with 100% oxygen Induction Type: IV induction Ventilation: Mask ventilation without difficulty Laryngoscope Size: Mac and 4 Grade View: Grade I Tube type: Oral Tube size: 7.5 mm Number of attempts: 1 Airway Equipment and Method: Stylet Placement Confirmation: ETT inserted through vocal cords under direct vision, positive ETCO2 and breath sounds checked- equal and bilateral Secured at: 23 cm Tube secured with: Tape Dental Injury: Teeth and Oropharynx as per pre-operative assessment

## 2022-05-04 ENCOUNTER — Encounter (HOSPITAL_COMMUNITY): Payer: Self-pay | Admitting: Surgery

## 2022-05-04 LAB — SURGICAL PATHOLOGY

## 2022-10-31 ENCOUNTER — Telehealth: Payer: Self-pay | Admitting: Cardiology

## 2022-10-31 NOTE — Telephone Encounter (Signed)
No VM set up unable to LM. Patient scheduled for tomorrow to see physician for follow up

## 2022-10-31 NOTE — Progress Notes (Unsigned)
Cardiology Office Note:    Date:  10/31/2022   ID:  Jared Robinson, DOB 1971/07/12, MRN [id number]  PCP:  Burton Apley, MD   [facility] Providers Cardiologist:  Bryan Lemma, MD { Click to update primary MD,subspecialty MD or APP then REFRESH:1}    Referring MD: Burton Apley, MD   No chief complaint on file. ***  History of Present Illness:    Jared Robinson is a 52 y.o. male with a hx of polysubstance abuse, cocaine/alcohol/tobacco abuse, noncardiac chest pain, chronic systolic heart failure/NICM with LVEF 40-45%, hypertension, COPD pain, and medication noncompliance.  He was hospitalized July 16, 2017 with 2 months of chest pain coincident with using cocaine.  He sought treatment and became abstinent to cocaine, alcohol, and cigarettes.  Echocardiogram January 2019 with LVEF 40-45% and diffuse hypokinesis.  Myoview 09/13/2017 was deemed a low risk study with probable prominent diaphragmatic attenuation, could not rule out prior infarct.  He was lost to follow-up and returned to Dr. Elissa Hefty clinic 12/29/2021 for recurrence of chest pain.  He underwent a CT coronary that showed a coronary calcium score of 0 and no significant CAD on 01/08/2022.  Echocardiogram on 01/10/2022 showed stable LVEF of 40-45% mild aortic valve calcification without aortic stenosis. He has a history of stopping medications and is therefore only maintained on carvedilol 3.125 mg twice daily.  He called our office with a 20 pound weight gain over the past 2 weeks and was placed on my schedule for follow-up. He presents today   He states he knows it was due to what he has eaten - 2 weeks ago had lower extremity swelling, swelling in his face and hands. Ate a whole jar of pickles and a whole jar of olives including juice of both, he was also pouring steak seasoning in his hand and eating it. He has smoked marijuana and had the munchies.   He has also had shortness of breath with exertion prior to  this event. He has been needing to take deep breaths.    Chronic systolic heart failure Nonischemic cardiomyopathy History of LVEF 40-45% and a CT coronary without CAD Will obtain basic labs today including BMP, BNP Start 40 mg Lasix daily Obtain repeat echocardiogram   Hypertension Coreg 3.125 mg twice daily    Past Medical History:  Diagnosis Date   Anxiety    CHF (congestive heart failure) (HCC) 2019   Chronic bronchitis (HCC)    Chronic lower back pain    COPD (chronic obstructive pulmonary disease) (HCC)    History of blood transfusion 1984   related to "tonsillectomy" (07/17/2017)   History of degenerative disc disease 2000   Hypertension    IBS (irritable bowel syndrome)     Past Surgical History:  Procedure Laterality Date   CHOLECYSTECTOMY N/A 05/03/2022   Procedure: LAPAROSCOPIC CHOLECYSTECTOMY WITH POSSIBLE INTRAOPERATIVE CHOLANGIOGRAM;  Surgeon: Harriette Bouillon, MD;  Location: [facility] OR;  Service: General;  Laterality: N/A;   EYE SURGERY Right    "fell out of crib when I was a baby; busted it open"   HAND SURGERY Left 2000   "degloving injury; vein graft, skin graft"   HERNIA REPAIR     INGUINAL HERNIA REPAIR Left    LUMBAR LAMINECTOMY     NM MYOVIEW LTD     LOW RISK.  EF 50-55%.  Basal inferior mid inferior defect that appears to be fixed.  Cannot exclude MI versus diaphragmatic attenuation.  Normal wall motion would argue against prior  infarct. --    TONSILLECTOMY  1984   TRANSTHORACIC ECHOCARDIOGRAM  07/2017   Mild to moderate reduced EF 40-45% with diffuse hypokinesis.  Otherwise normal.   UMBILICAL HERNIA REPAIR      Current Medications: No outpatient medications have been marked as taking for the 11/01/22 encounter (Appointment) with Marcelino Duster, PA.     Allergies:   Aspirin   Social History   Socioeconomic History   Marital status: Legally Separated    Spouse name: Not on file   Number of children: 3   Years of education: Not on file    Highest education level: Not on file  Occupational History   Not on file  Tobacco Use   Smoking status: Former    Packs/day: 1.00    Years: 29.00    Additional pack years: 0.00    Total pack years: 29.00    Types: Cigarettes    Quit date: 2021    Years since quitting: 3.2   Smokeless tobacco: Never  Vaping Use   Vaping Use: Never used  Substance and Sexual Activity   Alcohol use: Yes    Alcohol/week: 6.0 standard drinks of alcohol    Types: 6 Cans of beer per week   Drug use: Yes    Types: Marijuana    Comment: minimal   Sexual activity: Not Currently  Other Topics Concern   Not on file  Social History Narrative   Recently discharged from behavioral health: Indicates that he has not had a drink or any cocaine/marijuana since discharge.  He is also trying to be abstinent of cigarettes, but having a hard time.   Social Determinants of Health   Financial Resource Strain: Not on file  Food Insecurity: Not on file  Transportation Needs: Not on file  Physical Activity: Not on file  Stress: Not on file  Social Connections: Not on file     Family History: The patient's ***family history includes Esophageal cancer in his father; Hypertension in an other family member.  ROS:   Please see the history of present illness.    *** All other systems reviewed and are negative.  EKGs/Labs/Other Studies Reviewed:    The following studies were reviewed today:  Echo 01/10/22: 1. Left ventricular ejection fraction, by estimation, is 40 to 45%. The  left ventricle has mildly decreased function. The left ventricle  demonstrates global hypokinesis. Left ventricular diastolic parameters  were normal. The average left ventricular  global longitudinal strain is -14.3 %. The global longitudinal strain is  abnormal.   2. Right ventricular systolic function is normal. The right ventricular  size is normal.   3. The mitral valve is normal in structure. No evidence of mitral valve   regurgitation. No evidence of mitral stenosis.   4. The aortic valve is tricuspid. Aortic valve regurgitation is not  visualized. Aortic valve sclerosis/calcification is present, without any  evidence of aortic stenosis.   5. The inferior vena cava is normal in size with greater than 50%  respiratory variability, suggesting right atrial pressure of 3 mmHg.   CCTA 12/2021: IMPRESSION: 1. Coronary artery calcium score 0 Agatston units, suggesting low risk for future cardiac events.   2.  No significant coronary disease noted.  EKG:  EKG is *** ordered today.  The ekg ordered today demonstrates ***  Recent Labs: 05/03/2022: ALT 21; BUN 12; Creatinine, Ser 0.87; Hemoglobin 15.4; Platelets 274; Potassium 3.7; Sodium 135  Recent Lipid Panel    Component Value Date/Time  CHOL 178 07/18/2017 2041   TRIG 160 (H) 07/18/2017 2041   HDL 41 07/18/2017 2041   CHOLHDL 4.3 07/18/2017 2041   VLDL 32 07/18/2017 2041   LDLCALC 105 (H) 07/18/2017 2041     Risk Assessment/Calculations:   {Does this patient have ATRIAL FIBRILLATION?[phone]}  No BP recorded.  {Refresh Note OR Click here to enter BP  :1}***         Physical Exam:    VS:  There were no vitals taken for this visit.    Wt Readings from Last 3 Encounters:  05/03/22 185 lb (83.9 kg)  03/01/22 190 lb 3.2 oz (86.3 kg)  12/29/21 187 lb 3.2 oz (84.9 kg)     GEN: *** Well nourished, well developed in no acute distress HEENT: Normal NECK: No JVD; No carotid bruits LYMPHATICS: No lymphadenopathy CARDIAC: ***RRR, no murmurs, rubs, gallops RESPIRATORY:  Clear to auscultation without rales, wheezing or rhonchi  ABDOMEN: Soft, non-tender, non-distended MUSCULOSKELETAL:  No edema; No deformity  SKIN: Warm and dry NEUROLOGIC:  Alert and oriented x 3 PSYCHIATRIC:  Normal affect   ASSESSMENT:    No diagnosis found. PLAN:    In order of problems listed above:  ***      {Are you ordering a CV Procedure (e.g. stress  test, cath, DCCV, TEE, etc)?   Press F2        :[id number]}    Medication Adjustments/Labs and Tests Ordered: Current medicines are reviewed at length with the patient today.  Concerns regarding medicines are outlined above.  No orders of the defined types were placed in this encounter.  No orders of the defined types were placed in this encounter.   There are no Patient Instructions on file for this visit.   Signed, Marcelino Duster, [place]  10/31/2022 3:11 PM    [facility]

## 2022-10-31 NOTE — Telephone Encounter (Signed)
Pt c/o swelling: STAT is pt has developed SOB within 24 hours  If swelling, where is the swelling located? Not actively having swelling, but when it occurs it is in lower legs, feet, hands, and face.  How much weight have you gained and in what time span? Went from 195 lbs to 215 lbs 2 weeks ago  Have you gained 3 pounds in a day or 5 pounds in a week? Yes  Do you have a log of your daily weights (if so, list)? Ranging 195 - 215 lbs for past two weeks. Currently weighs 203 lbs   Are you currently taking a fluid pill? No   Are you currently SOB? No   Have you traveled recently? No   Patient states last occurrence of swelling was this weekend in hands while walking. An appt regarding this was scheduled for 04/18 with Micah Flesher regarding this. Please advise.

## 2022-11-01 ENCOUNTER — Ambulatory Visit: Payer: Commercial Managed Care - HMO | Attending: Physician Assistant | Admitting: Physician Assistant

## 2022-11-01 ENCOUNTER — Encounter: Payer: Self-pay | Admitting: Physician Assistant

## 2022-11-01 VITALS — BP 140/90 | HR 116 | Ht 70.0 in | Wt 201.0 lb

## 2022-11-01 DIAGNOSIS — F322 Major depressive disorder, single episode, severe without psychotic features: Secondary | ICD-10-CM | POA: Diagnosis not present

## 2022-11-01 DIAGNOSIS — I10 Essential (primary) hypertension: Secondary | ICD-10-CM

## 2022-11-01 DIAGNOSIS — I428 Other cardiomyopathies: Secondary | ICD-10-CM | POA: Diagnosis not present

## 2022-11-01 DIAGNOSIS — F1910 Other psychoactive substance abuse, uncomplicated: Secondary | ICD-10-CM

## 2022-11-01 DIAGNOSIS — I5022 Chronic systolic (congestive) heart failure: Secondary | ICD-10-CM

## 2022-11-01 LAB — CBC
Platelets: 277 10*3/uL (ref 150–450)
RBC: 5.07 x10E6/uL (ref 4.14–5.80)
WBC: 6.8 10*3/uL (ref 3.4–10.8)

## 2022-11-01 LAB — BRAIN NATRIURETIC PEPTIDE

## 2022-11-01 LAB — BASIC METABOLIC PANEL

## 2022-11-01 LAB — MAGNESIUM

## 2022-11-01 MED ORDER — METOPROLOL SUCCINATE ER 50 MG PO TB24: 50.0000 mg | ORAL_TABLET | Freq: Every evening | ORAL | 2 refills | Status: DC

## 2022-11-01 MED ORDER — SPIRONOLACTONE 25 MG PO TABS: 12.5000 mg | ORAL_TABLET | Freq: Every day | ORAL | 2 refills | Status: DC

## 2022-11-01 NOTE — Progress Notes (Signed)
Cardiology Office Note:    Date:  11/01/2022   ID:  Jared Robinson, DOB [birth year], MRN [id number]  PCP:  Burton Apley, MD   [facility] Providers Cardiologist:  Bryan Lemma, MD { Referring MD: Burton Apley, MD   Chief Complaint  Patient presents with   Follow-up    HFrEF    History of Present Illness:    Jared Robinson is a 52 y.o. male with a hx of polysubstance abuse, cocaine/alcohol/tobacco abuse, noncardiac chest pain, chronic systolic heart failure/NICM with LVEF 40-45%, hypertension, COPD pain, and medication noncompliance.  He was hospitalized July 16, 2017 with 2 months of chest pain coincident with using cocaine.  He sought treatment and became abstinent to cocaine, alcohol, and cigarettes.  Echocardiogram January 2019 with LVEF 40-45% and diffuse hypokinesis.  Myoview 09/13/2017 was deemed a low risk study with probable prominent diaphragmatic attenuation, could not rule out prior infarct.  He was lost to follow-up and returned to Dr. Elissa Hefty clinic 12/29/2021 for recurrence of chest pain.  He underwent a CT coronary that showed a coronary calcium score of 0 and no significant CAD on 01/08/2022.  Echocardiogram on 01/10/2022 showed stable LVEF of 40-45% mild aortic valve calcification without aortic stenosis. He has a history of stopping medications and is therefore only maintained on carvedilol 3.125 mg twice daily.  He called our office with a 20 pound weight gain over the past 2 weeks and was placed on my schedule for follow-up.  He states he knows it was due to what he has eaten - 2 weeks ago had lower extremity swelling, swelling in his face and hands. The night prior, he ate a whole jar of pickles and a whole jar of olives including juice of both, he was also pouring steak seasoning in his hand and eating it. He had smoked marijuana and had the munchies and no other snacks in the house. He denies any other drugs of abuse, no cocaine. He states he will no  longer smoke marijuana.   He has also had shortness of breath with exertion prior to this event. He has been needing to take deep breaths.   He is very anxious and has been anxious for his entire life. We discussed therapy. He is taking no medications at home.   Past Medical History:  Diagnosis Date   Anxiety    CHF (congestive heart failure) 2019   Chronic bronchitis    Chronic lower back pain    COPD (chronic obstructive pulmonary disease)    History of blood transfusion 1984   related to "tonsillectomy" (07/17/2017)   History of degenerative disc disease 2000   Hypertension    IBS (irritable bowel syndrome)     Past Surgical History:  Procedure Laterality Date   CHOLECYSTECTOMY N/A 05/03/2022   Procedure: LAPAROSCOPIC CHOLECYSTECTOMY WITH POSSIBLE INTRAOPERATIVE CHOLANGIOGRAM;  Surgeon: Harriette Bouillon, MD;  Location: [facility] OR;  Service: General;  Laterality: N/A;   EYE SURGERY Right    "fell out of crib when I was a baby; busted it open"   HAND SURGERY Left 2000   "degloving injury; vein graft, skin graft"   HERNIA REPAIR     INGUINAL HERNIA REPAIR Left    LUMBAR LAMINECTOMY     NM MYOVIEW LTD     LOW RISK.  EF 50-55%.  Basal inferior mid inferior defect that appears to be fixed.  Cannot exclude MI versus diaphragmatic attenuation.  Normal wall motion would argue against prior infarct. --  TONSILLECTOMY  1984   TRANSTHORACIC ECHOCARDIOGRAM  07/2017   Mild to moderate reduced EF 40-45% with diffuse hypokinesis.  Otherwise normal.   UMBILICAL HERNIA REPAIR      Current Medications: Current Meds  Medication Sig   carvedilol (COREG) 3.125 MG tablet Take 1 tablet (3.125 mg total) by mouth 2 (two) times daily.   metoprolol succinate (TOPROL XL) 50 MG 24 hr tablet Take 1 tablet (50 mg total) by mouth every evening. Take with or immediately following a meal.   spironolactone (ALDACTONE) 25 MG tablet Take 0.5 tablets (12.5 mg total) by mouth daily.     Allergies:   Aspirin    Social History   Socioeconomic History   Marital status: Legally Separated    Spouse name: Not on file   Number of children: 3   Years of education: Not on file   Highest education level: Not on file  Occupational History   Not on file  Tobacco Use   Smoking status: Former    Packs/day: 1.00    Years: 29.00    Additional pack years: 0.00    Total pack years: 29.00    Types: Cigarettes    Quit date: 2021    Years since quitting: 3.2   Smokeless tobacco: Never  Vaping Use   Vaping Use: Never used  Substance and Sexual Activity   Alcohol use: Yes    Alcohol/week: 6.0 standard drinks of alcohol    Types: 6 Cans of beer per week   Drug use: Yes    Types: Marijuana    Comment: minimal   Sexual activity: Not Currently  Other Topics Concern   Not on file  Social History Narrative   Recently discharged from behavioral health: Indicates that he has not had a drink or any cocaine/marijuana since discharge.  He is also trying to be abstinent of cigarettes, but having a hard time.   Social Determinants of Health   Financial Resource Strain: Not on file  Food Insecurity: Not on file  Transportation Needs: Not on file  Physical Activity: Not on file  Stress: Not on file  Social Connections: Not on file     Family History: The patient's family history includes Esophageal cancer in his father; Hypertension in an other family member.  ROS:   Please see the history of present illness.     All other systems reviewed and are negative.  EKGs/Labs/Other Studies Reviewed:    The following studies were reviewed today:  Echo 01/10/22: 1. Left ventricular ejection fraction, by estimation, is 40 to 45%. The  left ventricle has mildly decreased function. The left ventricle  demonstrates global hypokinesis. Left ventricular diastolic parameters  were normal. The average left ventricular  global longitudinal strain is -14.3 %. The global longitudinal strain is  abnormal.   2. Right  ventricular systolic function is normal. The right ventricular  size is normal.   3. The mitral valve is normal in structure. No evidence of mitral valve  regurgitation. No evidence of mitral stenosis.   4. The aortic valve is tricuspid. Aortic valve regurgitation is not  visualized. Aortic valve sclerosis/calcification is present, without any  evidence of aortic stenosis.   5. The inferior vena cava is normal in size with greater than 50%  respiratory variability, suggesting right atrial pressure of 3 mmHg.   CCTA 12/2021: IMPRESSION: 1. Coronary artery calcium score 0 Agatston units, suggesting low risk for future cardiac events.   2.  No significant coronary disease noted.  EKG:  EKG is  ordered today.  The ekg ordered today demonstrates sinus tachycardia with HR 106 Rhythm strip with sinus tachycardia HR in the 110s, PVCs, developed RBBB, no atrial flutter - reviewed with DOD Dr. [place]  Recent Labs: 05/03/2022: ALT 21; BUN 12; Creatinine, Ser 0.87; Hemoglobin 15.4; Platelets 274; Potassium 3.7; Sodium 135  Recent Lipid Panel    Component Value Date/Time   CHOL 178 07/18/2017 2041   TRIG 160 (H) 07/18/2017 2041   HDL 41 07/18/2017 2041   CHOLHDL 4.3 07/18/2017 2041   VLDL 32 07/18/2017 2041   LDLCALC 105 (H) 07/18/2017 2041     Risk Assessment/Calculations:      HYPERTENSION CONTROL Vitals:   11/01/22 0922 11/01/22 1043  BP: (!) 138/90 (!) 140/90    The patient's blood pressure is elevated above target today.  In order to address the patient's elevated BP: A new medication was prescribed today.          Physical Exam:    VS:  BP (!) 140/90   Pulse (!) 116   Ht 5\' 10"  (1.778 m)   Wt 201 lb (91.2 kg)   SpO2 98%   BMI 28.84 kg/m     Wt Readings from Last 3 Encounters:  11/01/22 201 lb (91.2 kg)  05/03/22 185 lb (83.9 kg)  03/01/22 190 lb 3.2 oz (86.3 kg)     GEN:  Well nourished, well developed in no acute distress HEENT: Normal NECK: No JVD; No  carotid bruits LYMPHATICS: No lymphadenopathy CARDIAC: sinus tachycardia RESPIRATORY:  Clear to auscultation without rales, wheezing or rhonchi  ABDOMEN: Soft, non-tender, non-distended MUSCULOSKELETAL:  No edema; No deformity  SKIN: Warm and dry NEUROLOGIC:  Alert and oriented x 3 PSYCHIATRIC:  Normal affect   ASSESSMENT:    1. Chronic systolic heart failure   2. NICM (nonischemic cardiomyopathy)   3. Essential hypertension   4. MDD (major depressive disorder), severe   5. Polysubstance abuse    PLAN:    In order of problems listed above:  Chronic systolic heart failure Nonischemic cardiomyopathy Hypertension Sinus tachycardia - ?rate related RBBB on rhythm strip Medication noncompliance History of LVEF 40-45% and a CT coronary without CAD Will obtain basic labs today including BMP, BNP Obtain repeat echocardiogram Given ST, will start 50 mg toprol at night and if labs stable, start 12.5 mg spironolactone Suprisingly he does not appear extremely volume up today.   Hoping the toprol will slow his sinus tachycardia prior to echo Depending on labs, hope to further titrate GDMT at next visit - will need repeat BMP with new spiro start.   Anxiety MDD Prior polysubstance abuse - referred for therapy     Follow up in 1 week.       Medication Adjustments/Labs and Tests Ordered: Current medicines are reviewed at length with the patient today.  Concerns regarding medicines are outlined above.  Orders Placed This Encounter  Procedures   CBC   Basic metabolic panel   Brain natriuretic peptide   Magnesium   EKG 12-Lead   ECHOCARDIOGRAM COMPLETE   Meds ordered this encounter  Medications   spironolactone (ALDACTONE) 25 MG tablet    Sig: Take 0.5 tablets (12.5 mg total) by mouth daily.    Dispense:  15 tablet    Refill:  2   metoprolol succinate (TOPROL XL) 50 MG 24 hr tablet    Sig: Take 1 tablet (50 mg total) by mouth every evening. Take with or immediately  following  a meal.    Dispense:  30 tablet    Refill:  2    There are no Patient Instructions on file for this visit.   Signed, Jared Robinson, [place]  11/01/2022 10:47 AM    [facility]

## 2022-11-01 NOTE — Patient Instructions (Signed)
Medication Instructions:  Start Toprol-XL 50 mg ( Take 1 Tablet Every Evening ). Start Spironolactone 25 mg ( Take 1/2 Tablet 12.5 mg Daily). *If you need a refill on your cardiac medications before your next appointment, please call your pharmacy*   Lab Work: BMP,BNP,CBC , Magnesium If you have labs (blood work) drawn today and your tests are completely normal, you will receive your results only by: MyChart Message (if you have MyChart) OR A paper copy in the mail If you have any lab test that is abnormal or we need to change your treatment, we will call you to review the results.   Testing/Procedures: [address]. Your physician has requested that you have an echocardiogram. Echocardiography is a painless test that uses sound waves to create images of your heart. It provides your doctor with information about the size and shape of your heart and how well your heart's chambers and valves are working. This procedure takes approximately one hour. There are no restrictions for this procedure. Please do NOT wear cologne, perfume, aftershave, or lotions (deodorant is allowed). Please arrive 15 minutes prior to your appointment time.    Follow-Up: At [facility], you and your health needs are our priority.  As part of our continuing mission to provide you with exceptional heart care, we have created designated Provider Care Teams.  These Care Teams include your primary Cardiologist (physician) and Advanced Practice Providers (APPs -  Physician Assistants and Nurse Practitioners) who all work together to provide you with the care you need, when you need it.  We recommend signing up for the patient portal called "MyChart".  Sign up information is provided on this After Visit Summary.  MyChart is used to connect with patients for Virtual Visits (Telemedicine).  Patients are able to view lab/test results, encounter notes, upcoming appointments, etc.  Non-urgent messages  can be sent to your provider as well.   To learn more about what you can do with MyChart, go to [email or web address].    Your next appointment:   1 week(s)  Provider:   First Available APP   Other Instructions Start Toprol- XL This Evening . Spironolactone Tomorrow.

## 2022-11-02 LAB — BASIC METABOLIC PANEL
BUN/Creatinine Ratio: 18 (ref 9–20)
Chloride: 100 mmol/L (ref 96–106)
Creatinine, Ser: 0.97 mg/dL (ref 0.76–1.27)

## 2022-11-03 LAB — BASIC METABOLIC PANEL
BUN: 17 mg/dL (ref 6–24)
CO2: 21 mmol/L (ref 20–29)
Calcium: 10.2 mg/dL (ref 8.7–10.2)
Potassium: 5.1 mmol/L (ref 3.5–5.2)
eGFR: 95 mL/min/{1.73_m2} (ref >59)

## 2022-11-03 LAB — CBC
Hematocrit: 43.5 % (ref 37.5–51.0)
Hemoglobin: 15.2 g/dL (ref 13.0–17.7)
MCH: 30 pg (ref 26.6–33.0)
MCHC: 34.9 g/dL (ref 31.5–35.7)
MCV: 86 fL (ref 79–97)
RDW: 12.5 % (ref 11.6–15.4)

## 2022-11-05 ENCOUNTER — Other Ambulatory Visit: Payer: Self-pay | Admitting: *Deleted

## 2022-11-09 ENCOUNTER — Ambulatory Visit: Payer: Commercial Managed Care - HMO | Attending: Student | Admitting: Student

## 2022-11-09 ENCOUNTER — Encounter: Payer: Self-pay | Admitting: Student

## 2022-11-09 VITALS — BP 121/83 | HR 80 | Ht 70.0 in | Wt 201.0 lb

## 2022-11-09 DIAGNOSIS — F1910 Other psychoactive substance abuse, uncomplicated: Secondary | ICD-10-CM | POA: Diagnosis not present

## 2022-11-09 DIAGNOSIS — I10 Essential (primary) hypertension: Secondary | ICD-10-CM | POA: Diagnosis not present

## 2022-11-09 DIAGNOSIS — I5022 Chronic systolic (congestive) heart failure: Secondary | ICD-10-CM | POA: Diagnosis not present

## 2022-11-09 MED ORDER — FUROSEMIDE 20 MG PO TABS: 20.0000 mg | ORAL_TABLET | ORAL | 3 refills | Status: AC | PRN

## 2022-11-09 NOTE — Patient Instructions (Signed)
Medication Instructions:  Your physician has recommended you make the following change in your medication:  START: Lasix 20mg  as needed if you gain 3lbs over night or 5lbs in a week.  *If you need a refill on your cardiac medications before your next appointment, please call your pharmacy*   Lab Work: NONE If you have labs (blood work) drawn today and your tests are completely normal, you will receive your results only by: MyChart Message (if you have MyChart) OR A paper copy in the mail If you have any lab test that is abnormal or we need to change your treatment, we will call you to review the results.   Testing/Procedures: NONE   Follow-Up: At [facility], you and your health needs are our priority.  As part of our continuing mission to provide you with exceptional heart care, we have created designated Provider Care Teams.  These Care Teams include your primary Cardiologist (physician) and Advanced Practice Providers (APPs -  Physician Assistants and Nurse Practitioners) who all work together to provide you with the care you need, when you need it.  We recommend signing up for the patient portal called "MyChart".  Sign up information is provided on this After Visit Summary.  MyChart is used to connect with patients for Virtual Visits (Telemedicine).  Patients are able to view lab/test results, encounter notes, upcoming appointments, etc.  Non-urgent messages can be sent to your provider as well.   To learn more about what you can do with MyChart, go to [email or web address].    Your next appointment:   1-2 week(s) after his scheduled ECHO   Provider:   Carlos Levering, NP     Other Instructions

## 2022-11-09 NOTE — Progress Notes (Signed)
Cardiology Clinic Note   Date: 11/09/2022 ID: Callan, Norden [birth year], MRN [id number]  Primary Cardiologist:  Bryan Lemma, MD  Patient Profile    Jared Robinson is a 51 y.o. male who presents to the clinic today for follow-up of medication changes.   Past medical history significant for: Chronic systolic heart failure/nonischemic cardiomyopathy. Echo 01/10/2022: EF 40 to 50%.  Global hypokinesis.  Aortic valve sclerosis/calcification without stenosis. Hypertension. Polysubstance abuse. Major depressive disorder. Anxiety. Medical noncompliance.   History of Present Illness    Jared Robinson was first evaluated by Dr. Herbie Baltimore on 08/19/2017 for hospital follow-up with complaints of chest pain and shortness of breath.  Echo while in the hospital showed EF 40 to 45% with diffuse hypokinesis.  Nuclear stress test was a low risk study showing defect in the basal inferior and mid inferior location worse and rest with normal motion suggesting artifact.  Patient was not seen again until 12/29/2021 for preoperative risk assessment prior to cholecystectomy.  At that time he complained of exertional chest pain and dyspnea.  Very CTA showed a calcium score of 0 with no significant extracardiac findings.  Echo showed mildly decreased LV function stable from 2019 (details above).  Patient was last seen in the office by Bettina Gavia, PA-C on 11/01/2022 with complaints of 20 pound weight gain over 2 weeks.  Patient reported 2 weeks prior he had lower extremity edema and swelling in hands and face.  He reported eating a whole jar of pickles and a whole jar of olives including juice of both as well as pouring steak seasoning in his hand and eating it secondary to having the "munchies" after smoking marijuana and there being no other snacks in the house.  He denied any other drug use.  Patient also admitted to DOE prior to the event.  He had not been taking any medications.  He was found to be tachycardic  but not extremely volume up.  He was started on Toprol and spironolactone.  He was referred to therapy for his mood/anxiety disorder.  Today, patient reports he is greatly improved. His BP has slowly come down from 135/99 on 11/02/2022 to 117/85 today. Weight has been stable between 198-201. He did not start spironolactone secondary to high normal potassium. He denies lower extremity edema, DOE, orthopnea or PND. He as been walking 30 minutes at the park every day without difficulty. No chest pain, pressure, or tightness. No palpitations. Will not repeat BMP today, as he did not start spironolactone.     ROS: All other systems reviewed and are otherwise negative except as noted in History of Present Illness.  Studies Reviewed    ECG is not ordered today.           Physical Exam    VS:  BP 121/83   Pulse 80   Ht 5\' 10"  (1.778 m)   Wt 201 lb (91.2 kg)   SpO2 99%   BMI 28.84 kg/m  , BMI Body mass index is 28.84 kg/m.  GEN: Well nourished, well developed, in no acute distress. Neck: No JVD or carotid bruits. Cardiac:  RRR. No murmurs. No rubs or gallops.   Respiratory:  Respirations regular and unlabored. Clear to auscultation without rales, wheezing or rhonchi. GI: Soft, nontender, nondistended. Extremities: Radials/DP/PT 2+ and equal bilaterally. No clubbing or cyanosis. No edema.  Skin: Warm and dry, no rash. Neuro: Strength intact.  Assessment & Plan    Chronic systolic heart failure/nonischemic cardiomyopathy.  Echo June 2023 showed EF 40 to 45% (stable from 2019).  At previous visit 1 week ago patient complained of increased weight, lower extremity edema, and swelling in hands and face after consuming a large volume of high sodium foods 1 night after smoking marijuana.  Patient reports feeling greatly improved since starting the medication. BP has come down from 135/99 on 11/02/2022 to 117/85 today. Weight has been stable from 198-201. He is still eating foods high in sodium but  is working on making better choices. He dd not start spironolactone secondary to high normal potassium. Euvolemic and well compensated on exam.  Will start Lasix 20 mg as needed for 3 lb weight overnight or 5 lb in a week. He has an echo scheduled for 5/202/24. Continue metoprolol.   Hypertension.  Today 121/83. Patient denies headaches or dizziness. Continue metoprolol.  Polysubstance abuse history.  Patient continues to smoke marijuana but has not used any other substance including alcohol. He will continue to abstain from marijuana.   Disposition: Provided Salty 6 information sheet. Lasix 20 mg as needed 3 lb overnight, 5 lb in a week. Keep scheduled echo visit. Return in 1 month following echo.          Signed, Etta Grandchild. Dunya Meiners, DNP, NP-C

## 2022-11-22 ENCOUNTER — Other Ambulatory Visit (HOSPITAL_BASED_OUTPATIENT_CLINIC_OR_DEPARTMENT_OTHER): Payer: Commercial Managed Care - HMO

## 2022-12-03 ENCOUNTER — Ambulatory Visit (INDEPENDENT_AMBULATORY_CARE_PROVIDER_SITE_OTHER): Payer: Commercial Managed Care - HMO

## 2022-12-03 DIAGNOSIS — I10 Essential (primary) hypertension: Secondary | ICD-10-CM

## 2022-12-03 DIAGNOSIS — I5022 Chronic systolic (congestive) heart failure: Secondary | ICD-10-CM

## 2022-12-03 DIAGNOSIS — I428 Other cardiomyopathies: Secondary | ICD-10-CM

## 2022-12-03 LAB — ECHOCARDIOGRAM COMPLETE
Area-P 1/2: 5.54 cm2
S' Lateral: 3.5 cm

## 2022-12-13 NOTE — Progress Notes (Deleted)
   Cardiology Clinic Note   Date: 12/13/2022 ID: Noan, Witko [birth year], MRN [id number]  Primary Cardiologist:  Bryan Lemma, MD  Patient Profile    LOCHLANN RAW is a 52 y.o. male who presents to the clinic today for ***  Past medical history significant for: Chronic systolic heart failure/nonischemic cardiomyopathy. Echo 01/10/2022: EF 40 to 50%.  Global hypokinesis.  Aortic valve sclerosis/calcification without stenosis. Echo 12/03/2022: EF 55 to 60%.  Normal RV function.  No significant valvular abnormalities. Hypertension. Polysubstance abuse. Major depressive disorder. Anxiety. Medical noncompliance.   History of Present Illness    OBAMA GARNO was first evaluated by Dr. Herbie Baltimore on 08/19/2017 for hospital follow-up with complaints of chest pain and shortness of breath. Echo while in the hospital showed EF 40 to 45% with diffuse hypokinesis. Nuclear stress test was a low risk study showing defect in the basal inferior and mid inferior location worse and rest with normal motion suggesting artifact. Patient was not seen again until 12/29/2021 for preoperative risk assessment prior to cholecystectomy. At that time he complained of exertional chest pain and dyspnea. Very CTA showed a calcium score of 0 with no significant extracardiac findings. Echo showed mildly decreased LV function stable from 2019 (details above).   Patient was last seen in the office by me on 11/09/2022 for follow-up after starting medication.  His BP was improved at that time and weight was stable.  Spironolactone was not started secondary to high potassium.  He was provided with a prescription for as needed Lasix for weight gain.   Today, patient ***    Chronic systolic heart failure/nonischemic cardiomyopathy.  Echo May 2024 showed EF 55-60%.  Patient*** Euvolemic and well compensated on exam. Continue metoprolol and as needed Lasix.   Hypertension.  Today***. Patient denies headaches or dizziness.  Continue metoprolol.  Polysubstance abuse history.  Patient continues to smoke marijuana but has not used any other substance including alcohol. He will continue to abstain from marijuana.***   ROS: All other systems reviewed and are otherwise negative except as noted in History of Present Illness.  Studies Reviewed    ECG personally reviewed by me today: ***  No significant changes from ***  Risk Assessment/Calculations    {Does this patient have ATRIAL FIBRILLATION?[phone]} No BP recorded.  {Refresh Note OR Click here to enter BP  :1}***        Physical Exam    VS:  There were no vitals taken for this visit. , BMI There is no height or weight on file to calculate BMI.  GEN: Well nourished, well developed, in no acute distress. Neck: No JVD or carotid bruits. Cardiac: *** RRR. No murmurs. No rubs or gallops.   Respiratory:  Respirations regular and unlabored. Clear to auscultation without rales, wheezing or rhonchi. GI: Soft, nontender, nondistended. Extremities: Radials/DP/PT 2+ and equal bilaterally. No clubbing or cyanosis. No edema ***  Skin: Warm and dry, no rash. Neuro: Strength intact.  Assessment & Plan   ***  Disposition: ***     {Are you ordering a CV Procedure (e.g. stress test, cath, DCCV, TEE, etc)?   Press F2        :[id number]}   Signed, Etta Grandchild. Davan Hark, DNP, NP-C

## 2022-12-14 ENCOUNTER — Ambulatory Visit: Payer: Commercial Managed Care - HMO | Attending: Student | Admitting: Student

## 2023-11-07 ENCOUNTER — Other Ambulatory Visit: Payer: Self-pay | Admitting: Physician Assistant

## 2023-11-14 NOTE — Progress Notes (Deleted)
 Cardiology Office Note:    Date:  11/14/2023   ID:  Jared Robinson, DOB 25-Apr-1971, MRN [id number]  PCP:  Dudley Ghee, MD  Cardiologist:  Randene Bustard, MD { Click to update primary MD,subspecialty MD or APP then REFRESH:1}    Referring MD: Dudley Ghee, MD   Chief Complaint: follow-up of CHF  History of Present Illness:    Jared Robinson is a 53 y.o. male with a history of normal coronaries on coronary CTA in 12/2021, non-ischemic cardiomyopathy/ chronic HFrEF with EF as low as 40-45% in the past but normalized to 55-60% on last Echo in 11/2022, COPD, hypertension, anxiety/ depression, polysubstance abuse, and medication non-compliance who is followed by Dr. Addie Holstein and presents today for routine follow-up.   Patient was referred to Dr. Addie Holstein in 08/2017 after recent hospitalization for chest pain in setting of cocaine use. Echo during that admission showed LVEF of 40-45% with diffuse hypokinesis and mild TR. He was not seen by Cardiology at that time but chest pain was felt to be due to cocaine and cardiomyopathy was felt to likely be due to cocaine and alcohol use. He also reported suicidal ideation during that admission. He was seen by Psychiatry who recommended inpatient psychiatric treatment/ stabilization. Outpatient Cardiology evaluation was recommended after this. At the initial visit with Dr. Addie Holstein, he was still having some intermittent chest pain. Myoview  was ordered and was low risk with probable prominent diaphragmatic attenuation but prior inferior infarct could not be ruled out. He was lost to follow-up for several years after this and was not seen again until 12/2021 at which time he was still having atypical chest pain. Coronary CTA and Echo were ordered for further evaluation. Coronary CTA showed a coronary calcium score of 0 with no evidence of significant CAD. Echo showed LVEF of 40-45% with global hypokinesis, normal RV function, and no significant valvular disease.  Treatment has been complicated by medication and dietary non-compliance.   He was last seen by Morey Ar, NP, in 10/2022 at which time he was doing better after being started on Toprol -XL. He had a repeat Echo in 11/2022 which showed improvement in EF to 55-60% with normal wall motion and diastolic parameters and no significant valvular disease. He was supposed to follow up 1 month after this but was again lost to follow-up.  [name]. ***  Chronic HFrEF Non-Ischemic Cardiomyopathy Initial diagnosed in 2019 when found to have EF of 40-45%. Coronary CTA in 12/2021 showed normal coronaries. Cardiomyopathy felt to likely be due to cocaine and/ or alcohol use. Management has been limited in the past due to medication non-compliance. However, EF has since normalized. Last Echo in 11/2022 showed LVEF of 55-60%. - Euvolemic on exam. *** - Continue Lasix  20mg  as needed for weight gain or edema. *** - Continue Toprol -XL 50mg  daily.  - No Spironoloctone in the past due to high normal/ high baseline potassium level. - Discussed importance of daily weights and sodium/ fluid restrictions. ***  Hypertension BP well controlled.  - Continue medications for CHF as above.  Polysubstance Abuse ***   EKGs/Labs/Other Studies Reviewed:    The following studies were reviewed:  Coronary CTA 01/08/2022: Impression: 1. Coronary artery calcium score 0 Agatston units, suggesting low risk for future cardiac events. 2.  No significant coronary disease noted. _______________  Echocardiogram 12/03/2022: Impressions: 1. Left ventricular ejection fraction, by estimation, is 55 to 60%. The  left ventricle has normal function. The left ventricle has no  regional  wall motion abnormalities. Left ventricular diastolic parameters were  normal.   2. Right ventricular systolic function is normal. The right ventricular  size is normal.   3. The mitral valve is normal in structure. No  evidence of mitral valve  regurgitation. No evidence of mitral stenosis.   4. The aortic valve is tricuspid. Aortic valve regurgitation is not  visualized. No aortic stenosis is present.   5. The inferior vena cava is normal in size with greater than 50%  respiratory variability, suggesting right atrial pressure of 3 mmHg.   EKG:  EKG ordered today. EKG personally reviewed and demonstrates ***.  Recent Labs: No results found for requested labs within last 365 days.  Recent Lipid Panel    Component Value Date/Time   CHOL 178 07/18/2017 2041   TRIG 160 (H) 07/18/2017 2041   HDL 41 07/18/2017 2041   CHOLHDL 4.3 07/18/2017 2041   VLDL 32 07/18/2017 2041   LDLCALC 105 (H) 07/18/2017 2041    Physical Exam:    Vital Signs: There were no vitals taken for this visit.    Wt Readings from Last 3 Encounters:  11/09/22 201 lb (91.2 kg)  11/01/22 201 lb (91.2 kg)  05/03/22 185 lb (83.9 kg)     General: 53 y.o. male in no acute distress. HEENT: Normocephalic and atraumatic. Sclera clear.  Neck: Supple. No carotid bruits. No JVD. Heart: *** RRR. Distinct S1 and S2. No murmurs, gallops, or rubs.  Lungs: No increased work of breathing. Clear to ausculation bilaterally. No wheezes, rhonchi, or rales.  Abdomen: Soft, non-distended, and non-tender to palpation.  Extremities: No lower extremity edema.  Radial and distal pedal pulses 2+ and equal bilaterally. Skin: Warm and dry. Neuro: No focal deficits. Psych: Normal affect. Responds appropriately.   Assessment:    No diagnosis found.  Plan:     Disposition: Follow up in ***   Signed, Macoy Rodwell E Jaquae Rieves, PA-C  11/14/2023 4:25 PM    [facility]

## 2023-11-26 ENCOUNTER — Ambulatory Visit: Payer: Self-pay | Attending: Student | Admitting: Student

## 2023-11-27 ENCOUNTER — Encounter: Payer: Self-pay | Admitting: Student

## 2023-12-30 NOTE — Progress Notes (Signed)
 Cardiology Office Note:    Date:  01/02/2024   ID:  Jared Robinson, DOB October 18, 1970, MRN [id number]  PCP:  Henry Ingle, MD  Cardiologist:  Alm Clay, MD     Referring MD: Henry Ingle, MD   Chief Complaint: follow-up of CHF  History of Present Illness:    Jared Robinson is a 53 y.o. male with a history of normal coronaries on coronary CTA in 12/2021, non-ischemic cardiomyopathy/ chronic HFrEF with EF as low as 40-45% in the past but normalized to 55-60% on last Echo in 11/2022, COPD, hypertension, anxiety/ depression, polysubstance abuse, and medication non-compliance who is followed by Dr. Clay and presents today for routine follow-up.   Patient was referred to Dr. Clay in 08/2017 after recent hospitalization for chest pain in setting of cocaine use. Echo during that admission showed LVEF of 40-45% with diffuse hypokinesis and mild TR. He was not seen by Cardiology at that time but chest pain was felt to be due to cocaine and cardiomyopathy was felt to likely be due to cocaine and alcohol use. He also reported suicidal ideation during that admission. He was seen by Psychiatry who recommended inpatient psychiatric treatment/ stabilization. Outpatient Cardiology evaluation was recommended after this. At the initial visit with Dr. Clay, he was still having some intermittent chest pain. Myoview  was ordered and was low risk with probable prominent diaphragmatic attenuation but prior inferior infarct could not be ruled out. He was lost to follow-up for several years after this and was not seen again until 12/2021 at which time he was still having atypical chest pain. Coronary CTA and Echo were ordered for further evaluation. Coronary CTA showed a coronary calcium score of 0 with no evidence of significant CAD. Echo showed LVEF of 40-45% with global hypokinesis, normal RV function, and no significant valvular disease. Treatment has been complicated by medication and dietary  non-compliance.   He was last seen by Barnie Hila, NP, in 10/2022 at which time he was doing better after being started on Toprol -XL. He had a repeat Echo in 11/2022 which showed improvement in EF to 55-60% with normal wall motion and diastolic parameters and no significant valvular disease. He was supposed to follow up 1 month after this but was again lost to follow-up.  [name].  Here with his mom.  He states he has been feeling well for the last 6 months.  He reports extreme fatigue and feeling exhausted all the time.  In addition, he describes intermittent dyspnea on exertion that occurs randomly not all the time.  He also states that he will sometimes get short of breath after talking for long periods of time or eating.  However, this again does not happen all the time and is random.  No shortness of breath at rest.  No orthopnea, PND, or edema. Weight is up 7 lbs on office scales from visit last year but he states weight is stable at home.  No chest pain.  He does describe some intermittent left left upper quadrant pain but no pain/discomfort in his chest.  He describes brief episodes of heart racing that only lasted for seconds at a time but no significant/sustained palpitations.  No lightheadedness, dizziness, syncope.   EKGs/Labs/Other Studies Reviewed:    The following studies were reviewed:   Coronary CTA 01/08/2022: Impression: 1. Coronary artery calcium score 0 Agatston units, suggesting low risk for future cardiac events. 2.  No significant coronary disease noted. _______________   Echocardiogram  12/03/2022: Impressions: 1. Left ventricular ejection fraction, by estimation, is 55 to 60%. The  left ventricle has normal function. The left ventricle has no regional  wall motion abnormalities. Left ventricular diastolic parameters were  normal.   2. Right ventricular systolic function is normal. The right ventricular  size is normal.   3. The mitral  valve is normal in structure. No evidence of mitral valve  regurgitation. No evidence of mitral stenosis.   4. The aortic valve is tricuspid. Aortic valve regurgitation is not  visualized. No aortic stenosis is present.   5. The inferior vena cava is normal in size with greater than 50%  respiratory variability, suggesting right atrial pressure of 3 mmHg.    EKG:  EKG ordered today.   EKG Interpretation Date/Time:  Thursday January 02 2024 15:25:56 EDT Ventricular Rate:  97 PR Interval:  124 QRS Duration:  126 QT Interval:  350 QTC Calculation: 444 R Axis:   79  Text Interpretation: Normal sinus rhythm Right bundle branch block When compared with ECG of 21-Jul-2017 08:37, Right bundle branch block is now Present Confirmed by Ayad Nieman [phone]) on 01/02/2024 4:31:10 PM    Recent Labs: No results found for requested labs within last 365 days.  Recent Lipid Panel    Component Value Date/Time   CHOL 178 07/18/2017 2041   TRIG 160 (H) 07/18/2017 2041   HDL 41 07/18/2017 2041   CHOLHDL 4.3 07/18/2017 2041   VLDL 32 07/18/2017 2041   LDLCALC 105 (H) 07/18/2017 2041    Physical Exam:    Vital Signs: BP 118/79 (BP Location: Right Arm)   Pulse 97   Ht 5' 10 (1.778 m)   Wt 208 lb (94.3 kg)   SpO2 95%   BMI 29.84 kg/m     Wt Readings from Last 3 Encounters:  01/02/24 208 lb (94.3 kg)  11/09/22 201 lb (91.2 kg)  11/01/22 201 lb (91.2 kg)     General: 53 y.o. Caucasian male in no acute distress. HEENT: Normocephalic and atraumatic. Sclera clear.  Neck: Supple. No carotid bruits. No JVD. Heart: RRR. Distinct S1 and S2. No murmurs, gallops, or rubs.  Lungs: No increased work of breathing. Clear to ausculation bilaterally. No wheezes, rhonchi, or rales.  Extremities: No lower extremity edema.  Skin: Warm and dry. Neuro: No focal deficits. Psych: Normal affect. Responds appropriately.   Assessment:    1. Dyspnea on exertion   2. Chronic systolic heart failure (HCC)    3. NICM (nonischemic cardiomyopathy) (HCC)   4. New onset right bundle branch block (RBBB)   5. Primary hypertension   6. Fatigue, unspecified type   7. History of polysubstance abuse     Plan:    Dyspnea on Exertion Chronic HFrEF Non-Ischemic Cardiomyopathy New RBBB Initial diagnosed in 2019 when found to have EF of 40-45%. Coronary CTA in 12/2021 showed normal coronaries. Cardiomyopathy felt to likely be due to cocaine and/ or alcohol use. Management has been limited in the past due to medication non-compliance. However, EF has since normalized. Last Echo in 11/2022 showed LVEF of 55-60%. - He describes extreme fatigue as well as intermittent dyspnea with exertion and when talking for long periods of time or eating for the last 6 months. However, no other signs or symptoms of CHF. Euvolemic on exam.  - EKG today shows new RBBB but no acute ischemic changes. - Continue Lasix  20mg  as needed for weight gain or edema.  - Continue Toprol -XL 50mg  daily.  -  No Spironoloctone in the past due to high normal/ high baseline potassium level. - Will check BNP and CMET today. - Will repeat Echo given new symptoms over the last 6 months and new RBBB on EKG today. Depending on Echo results and if symptoms persist, may need repeat ischemic evaluation as well.  Hypertension BP well controlled.  - Continue medications for CHF as above.  Fatigue Patient reports extreme fatigue over the last 6 months and reports feeling exhausted all the time. - Will check TSH, CBC, and CMET today.  History of Polysubstance Abuse He denies any tobacco, alcohol, or drug use since around 2018/ 2019.   Disposition: Follow up in 2-3 months after Echo.   Signed, Gene Colee E Callen Zuba, PA-C  01/02/2024 4:50 PM    [facility]

## 2024-01-02 ENCOUNTER — Other Ambulatory Visit: Payer: Self-pay | Admitting: *Deleted

## 2024-01-02 ENCOUNTER — Ambulatory Visit: Attending: Student | Admitting: Student

## 2024-01-02 ENCOUNTER — Encounter: Payer: Self-pay | Admitting: Student

## 2024-01-02 VITALS — BP 118/79 | HR 97 | Ht 70.0 in | Wt 208.0 lb

## 2024-01-02 DIAGNOSIS — I4510 Unspecified right bundle-branch block: Secondary | ICD-10-CM

## 2024-01-02 DIAGNOSIS — F1910 Other psychoactive substance abuse, uncomplicated: Secondary | ICD-10-CM

## 2024-01-02 DIAGNOSIS — R0609 Other forms of dyspnea: Secondary | ICD-10-CM

## 2024-01-02 DIAGNOSIS — R5383 Other fatigue: Secondary | ICD-10-CM

## 2024-01-02 DIAGNOSIS — I428 Other cardiomyopathies: Secondary | ICD-10-CM

## 2024-01-02 DIAGNOSIS — I5022 Chronic systolic (congestive) heart failure: Secondary | ICD-10-CM | POA: Diagnosis not present

## 2024-01-02 DIAGNOSIS — I10 Essential (primary) hypertension: Secondary | ICD-10-CM

## 2024-01-02 DIAGNOSIS — R0602 Shortness of breath: Secondary | ICD-10-CM | POA: Diagnosis not present

## 2024-01-02 NOTE — Patient Instructions (Addendum)
 Medication Instructions:  Your physician recommends that you continue on your current medications as directed. Please refer to the Current Medication list given to you today.  *If you need a refill on your cardiac medications before your next appointment, please call your pharmacy*  Lab Work: TODAY:  CMET, CBC, TSH, & PRO BNP  If you have labs (blood work) drawn today and your tests are completely normal, you will receive your results only by: MyChart Message (if you have MyChart) OR A paper copy in the mail If you have any lab test that is abnormal or we need to change your treatment, we will call you to review the results.  Testing/Procedures: Your physician has requested that you have an echocardiogram. Echocardiography is a painless test that uses sound waves to create images of your heart. It provides your doctor with information about the size and shape of your heart and how well your heart's chambers and valves are working. This procedure takes approximately one hour. There are no restrictions for this procedure. Please do NOT wear cologne, perfume, aftershave, or lotions (deodorant is allowed). Please arrive 15 minutes prior to your appointment time.  Please note: We ask at that you not bring children with you during ultrasound (echo/ vascular) testing. Due to room size and safety concerns, children are not allowed in the ultrasound rooms during exams. Our front office staff cannot provide observation of children in our lobby area while testing is being conducted. An adult accompanying a patient to their appointment will only be allowed in the ultrasound room at the discretion of the ultrasound technician under special circumstances. We apologize for any inconvenience.   Follow-Up: At [facility], you and your health needs are our priority.  As part of our continuing mission to provide you with exceptional heart care, our providers are all part of one team.  This team includes  your primary Cardiologist (physician) and Advanced Practice Providers or APPs (Physician Assistants and Nurse Practitioners) who all work together to provide you with the care you need, when you need it.  Your next appointment:   2-3 month(s) AFTER ECHO  Provider:   Callie Goodrich, PA-C          We recommend signing up for the patient portal called MyChart.  Sign up information is provided on this After Visit Summary.  MyChart is used to connect with patients for Virtual Visits (Telemedicine).  Patients are able to view lab/test results, encounter notes, upcoming appointments, etc.  Non-urgent messages can be sent to your provider as well.   To learn more about what you can do with MyChart, go to [email or web address].   Other Instructions

## 2024-01-03 LAB — COMPREHENSIVE METABOLIC PANEL WITH GFR
ALT: 25 IU/L (ref 0–44)
AST: 20 IU/L (ref 0–40)
Albumin: 4.2 g/dL (ref 3.8–4.9)
Alkaline Phosphatase: 94 IU/L (ref 44–121)
BUN/Creatinine Ratio: 22 — ABNORMAL HIGH (ref 9–20)
BUN: 17 mg/dL (ref 6–24)
Bilirubin Total: <0.2 mg/dL (ref 0.0–1.2)
CO2: 20 mmol/L (ref 20–29)
Calcium: 8.9 mg/dL (ref 8.7–10.2)
Chloride: 100 mmol/L (ref 96–106)
Creatinine, Ser: 0.78 mg/dL (ref 0.76–1.27)
Globulin, Total: 2.7 g/dL (ref 1.5–4.5)
Glucose: 98 mg/dL (ref 70–99)
Potassium: 4.4 mmol/L (ref 3.5–5.2)
Sodium: 137 mmol/L (ref 134–144)
Total Protein: 6.9 g/dL (ref 6.0–8.5)
eGFR: 108 mL/min/{1.73_m2} (ref >59)

## 2024-01-03 LAB — CBC
Hematocrit: 43.5 % (ref 37.5–51.0)
Hemoglobin: 14.8 g/dL (ref 13.0–17.7)
MCH: 30.2 pg (ref 26.6–33.0)
MCHC: 34 g/dL (ref 31.5–35.7)
MCV: 89 fL (ref 79–97)
Platelets: 300 10*3/uL (ref 150–450)
RBC: 4.9 x10E6/uL (ref 4.14–5.80)
RDW: 13 % (ref 11.6–15.4)
WBC: 7 10*3/uL (ref 3.4–10.8)

## 2024-01-03 LAB — TSH: TSH: 2.98 u[IU]/mL (ref 0.450–4.500)

## 2024-01-03 LAB — PRO B NATRIURETIC PEPTIDE: NT-Pro BNP: <36 pg/mL (ref 0–121)

## 2024-01-04 ENCOUNTER — Ambulatory Visit: Payer: Self-pay | Admitting: Student

## 2024-02-17 ENCOUNTER — Ambulatory Visit (HOSPITAL_COMMUNITY)
Admission: RE | Admit: 2024-02-17 | Discharge: 2024-02-17 | Disposition: A | Discharge status: Home / Self Care | Source: Ambulatory Visit | Attending: Cardiology | Admitting: Cardiology

## 2024-02-17 DIAGNOSIS — R5383 Other fatigue: Secondary | ICD-10-CM

## 2024-02-17 DIAGNOSIS — R0609 Other forms of dyspnea: Secondary | ICD-10-CM | POA: Diagnosis not present

## 2024-02-17 DIAGNOSIS — I10 Essential (primary) hypertension: Secondary | ICD-10-CM | POA: Diagnosis not present

## 2024-02-17 DIAGNOSIS — I428 Other cardiomyopathies: Secondary | ICD-10-CM

## 2024-02-17 DIAGNOSIS — I5022 Chronic systolic (congestive) heart failure: Secondary | ICD-10-CM | POA: Insufficient documentation

## 2024-02-17 LAB — ECHOCARDIOGRAM COMPLETE
Area-P 1/2: 3.03 cm2
S' Lateral: 3 cm

## 2024-03-23 ENCOUNTER — Ambulatory Visit: Admitting: Cardiology

## 2024-05-19 ENCOUNTER — Ambulatory Visit: Attending: Cardiology | Admitting: Cardiology

## 2024-05-19 ENCOUNTER — Encounter: Payer: Self-pay | Admitting: Cardiology
